# Patient Record
Sex: Female | Born: 1980 | Race: White | Hispanic: No | Marital: Married | Smoking: Never smoker
Health system: Southern US, Community
[De-identification: ages and names within clinical notes are randomized; demographics above are authoritative.]

## PROBLEM LIST (undated history)

## (undated) DIAGNOSIS — G43909 Migraine, unspecified, not intractable, without status migrainosus: Secondary | ICD-10-CM

## (undated) DIAGNOSIS — IMO0002 Reserved for concepts with insufficient information to code with codable children: Secondary | ICD-10-CM

## (undated) DIAGNOSIS — Z789 Other specified health status: Secondary | ICD-10-CM

## (undated) DIAGNOSIS — R110 Nausea: Secondary | ICD-10-CM

## (undated) DIAGNOSIS — O99340 Other mental disorders complicating pregnancy, unspecified trimester: Secondary | ICD-10-CM

## (undated) HISTORY — DX: Reserved for concepts with insufficient information to code with codable children: IMO0002

## (undated) HISTORY — DX: Nausea: R11.0

## (undated) HISTORY — DX: Migraine, unspecified, not intractable, without status migrainosus: G43.909

## (undated) HISTORY — PX: EYE SURGERY: SHX253

## (undated) HISTORY — PX: KNEE ARTHROSCOPY W/ ACL RECONSTRUCTION: SHX1858

## (undated) HISTORY — DX: Other mental disorders complicating pregnancy, unspecified trimester: O99.340

## (undated) HISTORY — PX: BREAST ENHANCEMENT SURGERY: SHX7

---

## 2007-01-16 ENCOUNTER — Other Ambulatory Visit: Admission: RE | Admit: 2007-01-16 | Discharge: 2007-01-16 | Payer: Self-pay | Admitting: Obstetrics and Gynecology

## 2008-03-02 ENCOUNTER — Other Ambulatory Visit: Admission: RE | Admit: 2008-03-02 | Discharge: 2008-03-02 | Payer: Self-pay | Admitting: Obstetrics and Gynecology

## 2010-06-05 ENCOUNTER — Inpatient Hospital Stay (HOSPITAL_COMMUNITY)
Admission: AD | Admit: 2010-06-05 | Discharge: 2010-06-07 | Payer: Self-pay | Source: Home / Self Care | Attending: Family Medicine | Admitting: Family Medicine

## 2010-06-09 ENCOUNTER — Encounter
Admission: RE | Admit: 2010-06-09 | Discharge: 2010-06-27 | Payer: Self-pay | Source: Home / Self Care | Attending: Obstetrics and Gynecology | Admitting: Obstetrics and Gynecology

## 2010-06-12 LAB — CBC
HCT: 41.7 % (ref 36.0–46.0)
HCT: 36.4 % (ref 36.0–46.0)
Hemoglobin: 14.5 g/dL (ref 12.0–15.0)
Hemoglobin: 12.2 g/dL (ref 12.0–15.0)
MCH: 31.4 pg (ref 26.0–34.0)
MCH: 30.3 pg (ref 26.0–34.0)
MCHC: 34.8 g/dL (ref 30.0–36.0)
MCHC: 33.5 g/dL (ref 30.0–36.0)
MCV: 90.3 fL (ref 78.0–100.0)
MCV: 90.5 fL (ref 78.0–100.0)
Platelets: 165 10*3/uL (ref 150–400)
Platelets: 137 10*3/uL — ABNORMAL LOW (ref 150–400)
RBC: 4.62 MIL/uL (ref 3.87–5.11)
RBC: 4.02 MIL/uL (ref 3.87–5.11)
RDW: 13 % (ref 11.5–15.5)
RDW: 13 % (ref 11.5–15.5)
WBC: 11.4 10*3/uL — ABNORMAL HIGH (ref 4.0–10.5)
WBC: 8.9 10*3/uL (ref 4.0–10.5)

## 2010-06-12 LAB — RPR: RPR Ser Ql: NONREACTIVE

## 2010-06-15 ENCOUNTER — Ambulatory Visit
Admission: RE | Admit: 2010-06-15 | Discharge: 2010-06-15 | Payer: Self-pay | Source: Home / Self Care | Admitting: Obstetrics and Gynecology

## 2010-09-26 ENCOUNTER — Inpatient Hospital Stay (HOSPITAL_COMMUNITY): Admission: AD | Admit: 2010-09-26 | Payer: Self-pay | Source: Home / Self Care | Admitting: Obstetrics and Gynecology

## 2010-11-26 ENCOUNTER — Inpatient Hospital Stay (HOSPITAL_COMMUNITY): Payer: 59

## 2010-11-26 ENCOUNTER — Inpatient Hospital Stay (HOSPITAL_COMMUNITY)
Admission: AD | Admit: 2010-11-26 | Discharge: 2010-11-26 | Disposition: A | Discharge status: Home / Self Care | Payer: 59 | Source: Ambulatory Visit | Attending: Obstetrics | Admitting: Obstetrics

## 2010-11-26 DIAGNOSIS — O26859 Spotting complicating pregnancy, unspecified trimester: Secondary | ICD-10-CM | POA: Insufficient documentation

## 2010-11-26 LAB — URINALYSIS, ROUTINE W REFLEX MICROSCOPIC
Bilirubin Urine: NEGATIVE
Glucose, UA: NEGATIVE mg/dL
Ketones, ur: NEGATIVE mg/dL
Leukocytes, UA: NEGATIVE
Nitrite: NEGATIVE
Protein, ur: NEGATIVE mg/dL
Specific Gravity, Urine: <1.005 — ABNORMAL LOW (ref 1.005–1.030)
Urobilinogen, UA: 0.2 mg/dL (ref 0.0–1.0)
pH: 6 (ref 5.0–8.0)

## 2010-11-26 LAB — URINE MICROSCOPIC-ADD ON

## 2010-12-14 LAB — ABO/RH: RH Type: POSITIVE

## 2010-12-14 LAB — GC/CHLAMYDIA PROBE AMP, GENITAL
Chlamydia: NEGATIVE
Gonorrhea: NEGATIVE

## 2010-12-14 LAB — HIV ANTIBODY (ROUTINE TESTING W REFLEX): HIV: NONREACTIVE

## 2010-12-14 LAB — RUBELLA ANTIBODY, IGM: Rubella: IMMUNE

## 2010-12-14 LAB — RPR: RPR: NONREACTIVE

## 2010-12-14 LAB — HEPATITIS B SURFACE ANTIGEN: Hepatitis B Surface Ag: NEGATIVE

## 2010-12-14 LAB — STREP B DNA PROBE: GBS: POSITIVE

## 2010-12-14 LAB — ANTIBODY SCREEN: Antibody Screen: NEGATIVE

## 2011-05-23 ENCOUNTER — Inpatient Hospital Stay (HOSPITAL_COMMUNITY)
Admission: AD | Admit: 2011-05-23 | Discharge: 2011-05-23 | Disposition: A | Discharge status: Home / Self Care | Payer: 59 | Source: Ambulatory Visit | Attending: Obstetrics and Gynecology | Admitting: Obstetrics and Gynecology

## 2011-05-23 ENCOUNTER — Encounter (HOSPITAL_COMMUNITY): Payer: Self-pay | Admitting: *Deleted

## 2011-05-23 DIAGNOSIS — O212 Late vomiting of pregnancy: Secondary | ICD-10-CM | POA: Insufficient documentation

## 2011-05-23 HISTORY — DX: Other specified health status: Z78.9

## 2011-05-23 LAB — CBC
HCT: 39 % (ref 36.0–46.0)
Hemoglobin: 13.4 g/dL (ref 12.0–15.0)
MCH: 31.2 pg (ref 26.0–34.0)
MCHC: 34.4 g/dL (ref 30.0–36.0)
MCV: 90.7 fL (ref 78.0–100.0)
Platelets: 150 10*3/uL (ref 150–400)
RBC: 4.3 MIL/uL (ref 3.87–5.11)
RDW: 13.2 % (ref 11.5–15.5)
WBC: 11.5 10*3/uL — ABNORMAL HIGH (ref 4.0–10.5)

## 2011-05-23 LAB — URINALYSIS, ROUTINE W REFLEX MICROSCOPIC
Bilirubin Urine: NEGATIVE
Glucose, UA: NEGATIVE mg/dL
Hgb urine dipstick: NEGATIVE
Ketones, ur: 15 mg/dL — AB
Nitrite: NEGATIVE
Protein, ur: NEGATIVE mg/dL
Specific Gravity, Urine: >1.03 — ABNORMAL HIGH (ref 1.005–1.030)
Urobilinogen, UA: 0.2 mg/dL (ref 0.0–1.0)
pH: 6 (ref 5.0–8.0)

## 2011-05-23 LAB — COMPREHENSIVE METABOLIC PANEL
ALT: 13 U/L (ref 0–35)
AST: 17 U/L (ref 0–37)
Albumin: 2.9 g/dL — ABNORMAL LOW (ref 3.5–5.2)
Alkaline Phosphatase: 85 U/L (ref 39–117)
BUN: 8 mg/dL (ref 6–23)
CO2: 24 mEq/L (ref 19–32)
Calcium: 8.5 mg/dL (ref 8.4–10.5)
Chloride: 103 mEq/L (ref 96–112)
Creatinine, Ser: 0.51 mg/dL (ref 0.50–1.10)
GFR calc Af Amer: >90 mL/min (ref >90)
GFR calc non Af Amer: >90 mL/min (ref >90)
Glucose, Bld: 84 mg/dL (ref 70–99)
Potassium: 4.1 mEq/L (ref 3.5–5.1)
Sodium: 136 mEq/L (ref 135–145)
Total Bilirubin: 0.5 mg/dL (ref 0.3–1.2)
Total Protein: 6.7 g/dL (ref 6.0–8.3)

## 2011-05-23 LAB — URINE MICROSCOPIC-ADD ON

## 2011-05-23 LAB — FETAL FIBRONECTIN: Fetal Fibronectin: NEGATIVE

## 2011-05-23 MED ORDER — DEXTROSE-NACL 5-0.45 % IV SOLN
INTRAVENOUS | Status: DC
  Administered 2011-05-23: 16:00:00 via INTRAVENOUS

## 2011-05-23 MED ORDER — PROMETHAZINE HCL 25 MG/ML IJ SOLN
12.5000 mg | Freq: Once | INTRAMUSCULAR | Status: AC
  Administered 2011-05-23: 16:00:00 via INTRAVENOUS
  Filled 2011-05-23: qty 1

## 2011-05-23 MED ORDER — ONDANSETRON 8 MG PO TBDP
8.0000 mg | ORAL_TABLET | Freq: Once | ORAL | Status: AC
  Administered 2011-05-23: 8 mg via ORAL
  Filled 2011-05-23: qty 1

## 2011-05-23 NOTE — Progress Notes (Signed)
Pt in c/o nausea and vomiting since 0500.  Reports lower back pain, but denies any abdominal pain or leaking of fluid or bleeding.  Denies any diarrhea.  Believes it may be from cheesecake that she ate yesterday. + FM.

## 2011-05-23 NOTE — ED Notes (Signed)
Waiting on mom to return 

## 2011-05-23 NOTE — ED Notes (Signed)
Pt still nauseated, has not thrown up.dozing at times.  Plan discussed, mom went to pick up rx.

## 2011-05-23 NOTE — Progress Notes (Signed)
Woke up this morning feeling sick.  (MOL is also sick)

## 2011-05-23 NOTE — ED Notes (Signed)
Nausea comes in waves, shorter now.  Water given, encouraged to sip slowly.

## 2011-05-23 NOTE — ED Provider Notes (Signed)
History   Nausea and vomiting  Chief Complaint  Patient presents with  . Emesis   HPI See dictated note  OB History    Grav Para Term Preterm Abortions TAB SAB Ect Mult Living   2 1 1  0 0 0 0 0 0 1      Past Medical History  Diagnosis Date  . No pertinent past medical history     Past Surgical History  Procedure Date  . Breast enhancement surgery   . Knee arthroscopy w/ acl reconstruction     left    Family History  Problem Relation Age of Onset  . Anesthesia problems Neg Hx     History  Substance Use Topics  . Smoking status: Never Smoker   . Smokeless tobacco: Never Used  . Alcohol Use: Yes     occ, none with preg    Allergies: No Known Allergies  No prescriptions prior to admission    ROS Physical Exam dictated   Blood pressure 120/72, pulse 95, temperature 98.2 F (36.8 C), temperature source Oral, resp. rate 18, height 5\' 5"  (1.651 m), weight 68.13 kg (150 lb 3.2 oz), unknown if currently breastfeeding.  Physical Exam  MAU Course  Procedures  MDM na  Assessment and Plan  33 weeks Nausea and vomiting- no s/s HELLP Will give Zofran and attempt to PO hydrate IVF if no improvement.  Kaian Fahs J 05/23/2011, 2:25 PM

## 2011-05-23 NOTE — ED Notes (Signed)
Plan discussed with MD and pt, will give Zofran ODT and then try p.o,. Hydration, MD going to OR, will write order for IV fluids if needed and p.o. Fluids not tolerated.

## 2011-05-24 NOTE — Consult Note (Signed)
NAMELAVAEH, BAU                 ACCOUNT NO.:  [id number]  MEDICAL RECORD NO.:  [id number]  LOCATION:  AV40                          FACILITY:  [facility]  PHYSICIAN:  Lenoard Aden, M.D.DATE OF BIRTH:  04-01-1981  DATE OF CONSULTATION:  05/23/2011 DATE OF DISCHARGE:  05/23/2011                                CONSULTATION   CHIEF COMPLAINT:  Nausea and vomiting.  HISTORY OF PRESENT ILLNESS:  A 30 year old white female,G2 P1 at [redacted] weeks gestation with uncomplicated pregnancy to date who presents with a short less than 8 hour onset of nausea and vomiting, questionably related to food ingestion.  She also has a family member who is currently ill with a similar illness.  She denies headaches, visual symptoms, or epigastric pain.  She denies diarrhea.  She reports good fetal movement.  She denies bleeding or leakage of fluid.  She denies contractions.  ALLERGIES:  She has no known drug allergies.  MEDICATIONS:  Zofran, which was prescribed.  Today the patient has not picked it up, and prenatal vitamins.  FAMILY HISTORY:  She has family history of multiple sclerosis, melanoma hypertension, heart disease, and diabetes.  SOCIAL HISTORY:  She is a nonsmoker, nondrinker.  Denies domestic or physical violence.  PAST MEDICAL HISTORY:  Uncomplicated vaginal delivery.  Pregnancy is complicated by GBS bacteriuria.  PAST SURGICAL HISTORY:  Remarkable for breast augmentation and ACL surgery.  PHYSICAL EXAMINATION:  GENERAL:  She is a well-developed, well- nourished, white female, in no acute distress. HEENT:  Normal. NECK:  Supple.  Full range of motion. LUNGS:  Clear. HEART:  Regular rhythm. ABDOMEN:  Soft, gravid, and nontender.  No right upper quadrant tenderness.  Cervical exam was deferred.  DTRs 2+.  No evidence of clonus.  No pretibial edema. EXTREMITIES:  No cords. NEUROLOGIC:  Nonfocal. SKIN:  Intact.  NST is reactive with irregular contractions noted.  CBC and CMP  is pending.  The patient did not respond to 1 oral dose of Zofran ODT 8 mg.  IMPRESSION: 1. A 33 week intrauterine pregnancy. 2. Nausea and vomiting.  Probable viral gastroenteritis, acute.  PLAN:  Start IV fluid hydration.  Check labs as noted.  Continuous monitoring, Zofran prescription previously sent.  Anticipate discharge home after IV fluid hydration.     Lenoard Aden, M.D.     RJT/MEDQ  D:  05/23/2011  T:  05/24/2011  Job:  [id number]

## 2011-05-29 NOTE — L&D Delivery Note (Signed)
Delivery Note At 11:33 PM a viable and healthy female was delivered via Vaginal, Spontaneous Delivery (Presentation: Right Occiput Anterior).  APGAR: 9, 9; weight .  2985gms Placenta status: Intact, Spontaneous.  Cord: 3 vessels with the following complications: None.  Cord pH: na  Anesthesia: Epidural  Episiotomy: None Lacerations: second Suture Repair: 2.0 vicryl rapide Est. Blood Loss (mL): 300  Mom to postpartum.  Baby to nursery-stable.  Jamea Robicheaux J 07/01/2011, 11:45 PM

## 2011-06-29 ENCOUNTER — Encounter (HOSPITAL_COMMUNITY): Payer: Self-pay | Admitting: *Deleted

## 2011-06-29 ENCOUNTER — Telehealth (HOSPITAL_COMMUNITY): Payer: Self-pay | Admitting: *Deleted

## 2011-06-29 NOTE — Telephone Encounter (Signed)
Preadmission screen  

## 2011-07-01 ENCOUNTER — Inpatient Hospital Stay (HOSPITAL_COMMUNITY): Payer: 59 | Admitting: Anesthesiology

## 2011-07-01 ENCOUNTER — Encounter (HOSPITAL_COMMUNITY): Payer: Self-pay | Admitting: *Deleted

## 2011-07-01 ENCOUNTER — Inpatient Hospital Stay (HOSPITAL_COMMUNITY)
Admission: AD | Admit: 2011-07-01 | Discharge: 2011-07-03 | DRG: 775 | Disposition: A | Discharge status: Home / Self Care | Payer: 59 | Source: Ambulatory Visit | Attending: Obstetrics & Gynecology | Admitting: Obstetrics & Gynecology

## 2011-07-01 ENCOUNTER — Encounter (HOSPITAL_COMMUNITY): Payer: Self-pay | Admitting: Anesthesiology

## 2011-07-01 DIAGNOSIS — Z22330 Carrier of Group B streptococcus: Secondary | ICD-10-CM

## 2011-07-01 DIAGNOSIS — O99892 Other specified diseases and conditions complicating childbirth: Secondary | ICD-10-CM | POA: Diagnosis present

## 2011-07-01 LAB — CBC
MCH: 31.2 pg (ref 26.0–34.0)
MCHC: 34.6 g/dL (ref 30.0–36.0)
MCV: 90.3 fL (ref 78.0–100.0)
Platelets: 176 10*3/uL (ref 150–400)
RDW: 13.2 % (ref 11.5–15.5)

## 2011-07-01 MED ORDER — LACTATED RINGERS IV SOLN: 500.0000 mL | INTRAVENOUS | Status: DC | PRN

## 2011-07-01 MED ORDER — PHENYLEPHRINE 40 MCG/ML (10ML) SYRINGE FOR IV PUSH (FOR BLOOD PRESSURE SUPPORT)
80.0000 ug | PREFILLED_SYRINGE | INTRAVENOUS | Status: DC | PRN
  Filled 2011-07-01 (×2): qty 5

## 2011-07-01 MED ORDER — OXYTOCIN BOLUS FROM INFUSION
500.0000 mL | Freq: Once | INTRAVENOUS | Status: DC
  Filled 2011-07-01: qty 500

## 2011-07-01 MED ORDER — PSEUDOEPHEDRINE HCL 30 MG PO TABS: 30.0000 mg | ORAL_TABLET | Freq: Three times a day (TID) | ORAL | Status: DC | PRN

## 2011-07-01 MED ORDER — OXYTOCIN 20 UNITS IN LACTATED RINGERS INFUSION - SIMPLE
125.0000 mL/h | Freq: Once | INTRAVENOUS | Status: AC
  Administered 2011-07-01: 125 mL/h via INTRAVENOUS

## 2011-07-01 MED ORDER — PENICILLIN G POTASSIUM 5000000 UNITS IJ SOLR
2.5000 10*6.[IU] | INTRAVENOUS | Status: DC
  Filled 2011-07-01 (×5): qty 2.5

## 2011-07-01 MED ORDER — PHENYLEPHRINE 40 MCG/ML (10ML) SYRINGE FOR IV PUSH (FOR BLOOD PRESSURE SUPPORT): 80.0000 ug | PREFILLED_SYRINGE | INTRAVENOUS | Status: DC | PRN

## 2011-07-01 MED ORDER — IBUPROFEN 600 MG PO TABS: 600.0000 mg | ORAL_TABLET | Freq: Four times a day (QID) | ORAL | Status: DC | PRN

## 2011-07-01 MED ORDER — EPHEDRINE 5 MG/ML INJ
10.0000 mg | INTRAVENOUS | Status: DC | PRN
  Filled 2011-07-01 (×2): qty 4

## 2011-07-01 MED ORDER — ACETAMINOPHEN 325 MG PO TABS: 650.0000 mg | ORAL_TABLET | ORAL | Status: DC | PRN

## 2011-07-01 MED ORDER — FENTANYL 2.5 MCG/ML BUPIVACAINE 1/10 % EPIDURAL INFUSION (WH - ANES)
INTRAMUSCULAR | Status: DC | PRN
  Administered 2011-07-01: 14 mL/h via EPIDURAL

## 2011-07-01 MED ORDER — FENTANYL 2.5 MCG/ML BUPIVACAINE 1/10 % EPIDURAL INFUSION (WH - ANES)
14.0000 mL/h | INTRAMUSCULAR | Status: DC
  Filled 2011-07-01 (×2): qty 60

## 2011-07-01 MED ORDER — TERBUTALINE SULFATE 1 MG/ML IJ SOLN: 0.2500 mg | Freq: Once | INTRAMUSCULAR | Status: AC | PRN

## 2011-07-01 MED ORDER — PENICILLIN G POTASSIUM 5000000 UNITS IJ SOLR
5.0000 10*6.[IU] | Freq: Once | INTRAVENOUS | Status: AC
  Administered 2011-07-01: 5 10*6.[IU] via INTRAVENOUS
  Filled 2011-07-01: qty 5

## 2011-07-01 MED ORDER — CITRIC ACID-SODIUM CITRATE 334-500 MG/5ML PO SOLN: 30.0000 mL | ORAL | Status: DC | PRN

## 2011-07-01 MED ORDER — LIDOCAINE HCL (PF) 1 % IJ SOLN
30.0000 mL | INTRAMUSCULAR | Status: DC | PRN
  Filled 2011-07-01: qty 30

## 2011-07-01 MED ORDER — EPHEDRINE 5 MG/ML INJ: 10.0000 mg | INTRAVENOUS | Status: DC | PRN

## 2011-07-01 MED ORDER — LACTATED RINGERS IV SOLN
INTRAVENOUS | Status: DC
  Administered 2011-07-01: 20:00:00 via INTRAVENOUS

## 2011-07-01 MED ORDER — LACTATED RINGERS IV SOLN: 500.0000 mL | Freq: Once | INTRAVENOUS | Status: DC

## 2011-07-01 MED ORDER — FLEET ENEMA 7-19 GM/118ML RE ENEM: 1.0000 | ENEMA | RECTAL | Status: DC | PRN

## 2011-07-01 MED ORDER — DIPHENHYDRAMINE HCL 50 MG/ML IJ SOLN: 12.5000 mg | INTRAMUSCULAR | Status: DC | PRN

## 2011-07-01 MED ORDER — OXYCODONE-ACETAMINOPHEN 5-325 MG PO TABS: 1.0000 | ORAL_TABLET | ORAL | Status: DC | PRN

## 2011-07-01 MED ORDER — ONDANSETRON HCL 4 MG/2ML IJ SOLN
4.0000 mg | Freq: Four times a day (QID) | INTRAMUSCULAR | Status: DC | PRN
  Administered 2011-07-01: 4 mg via INTRAVENOUS
  Filled 2011-07-01: qty 2

## 2011-07-01 MED ORDER — OXYTOCIN 20 UNITS IN LACTATED RINGERS INFUSION - SIMPLE
1.0000 m[IU]/min | INTRAVENOUS | Status: DC
  Administered 2011-07-01: 2 m[IU]/min via INTRAVENOUS
  Filled 2011-07-01: qty 1000

## 2011-07-01 MED ORDER — SODIUM BICARBONATE 8.4 % IV SOLN
INTRAVENOUS | Status: DC | PRN
  Administered 2011-07-01: 4 mL via EPIDURAL

## 2011-07-01 NOTE — Progress Notes (Signed)
Stacie Lopez is a 31 y.o. G2P1001 at [redacted]w[redacted]d by LMP admitted for active labor  Subjective:   Objective: BP 117/84  Pulse 87  Temp(Src) 98.5 F (36.9 C) (Oral)  Resp 20  Ht 5\' 5"  (1.651 m)  Wt 70.943 kg (156 lb 6.4 oz)  BMI 26.03 kg/m2  SpO2 100%  Breastfeeding? Unknown      FHT:  FHR: 150 bpm, variability: moderate,  accelerations:  Present,  decelerations:  Absent UC:   irregular, every 2-4 minutes SVE:   6/80/0 AROM- clear  Labs: Lab Results  Component Value Date   WBC 9.5 07/01/2011   HGB 12.9 07/01/2011   HCT 37.3 07/01/2011   MCV 90.3 07/01/2011   PLT 176 07/01/2011    Assessment / Plan: Spontaneous labor, progressing normally  Labor: Progressing normally Preeclampsia:  na Fetal Wellbeing:  Category I Pain Control:  Epidural I/D:  n/a Anticipated MOD:  NSVD  Danni Shima J 07/01/2011, 9:41 PM

## 2011-07-01 NOTE — Anesthesia Procedure Notes (Signed)

## 2011-07-01 NOTE — Progress Notes (Signed)
Jose Alleyne is a 31 y.o. G2P1001 at [redacted]w[redacted]d by LMP admitted for active labor  Subjective: contractions  Objective: BP 119/68  Pulse 81  Temp(Src) 98.5 F (36.9 C) (Oral)  Resp 18  Ht 5\' 5"  (1.651 m)  Wt 70.943 kg (156 lb 6.4 oz)  BMI 26.03 kg/m2  Breastfeeding? Unknown      FHT:  FHR: 155 bpm, variability: moderate,  accelerations:  Present,  decelerations:  Absent UC:   irregular, every 2-5 minutes SVE:   Dilation: 5.5 Effacement (%): 80 Station: -1;0 Exam by:: Humphrey Rolls, RN  Labs: Lab Results  Component Value Date   WBC 9.5 07/01/2011   HGB 12.9 07/01/2011   HCT 37.3 07/01/2011   MCV 90.3 07/01/2011   PLT 176 07/01/2011    Assessment / Plan: Spontaneous labor, progressing normally  Labor: Progressing normally Preeclampsia:  na Fetal Wellbeing:  Category I Pain Control:  Epidural I/D:  n/a Anticipated MOD:  NSVD  Britne Borelli J 07/01/2011, 8:10 PM  wq

## 2011-07-01 NOTE — Progress Notes (Signed)
Pt may go to room 170. 

## 2011-07-01 NOTE — Progress Notes (Signed)
Pt reprots having ctx q 4 min for the past 2 hours. Pt deneis SROM or bleeding at this time. Reports good fetal movement.

## 2011-07-01 NOTE — Anesthesia Preprocedure Evaluation (Signed)

## 2011-07-01 NOTE — Progress Notes (Signed)
Waiting on call back from md.

## 2011-07-01 NOTE — H&P (Signed)
NAMENOORAH, GIAMMONA                 ACCOUNT NO.:  [id number]  MEDICAL RECORD NO.:  [id number]  LOCATION:  9164                          FACILITY:  [facility]  PHYSICIAN:  Lenoard Aden, M.D.DATE OF BIRTH:  1980-10-27  DATE OF ADMISSION:  07/01/2011 DATE OF DISCHARGE:                             HISTORY & PHYSICAL   CHIEF COMPLAINT:  Labor.  HISTORY OF PRESENT ILLNESS:  A 31 year old white female, G2, P1 at 44 and 2/7th weeks' gestation with increased frequency of contractions today.  SOCIAL HISTORY:  She is a nonsmoker, nondrinker. She denies domestic or physical violence.  ALLERGIES:  She has no known drug allergies.  MEDICATIONS:  Zofran, p.r.n. Zoloft, and prenatal vitamins.  PAST MEDICAL HISTORY:  She has a noncontributory medical history.  FAMILY HISTORY:  She has a family history of diabetes, hypertension, melanoma, and multiple sclerosis.  PAST OB/GYN HISTORY:  Previous history of spontaneous vaginal delivery in January 2000.  PAST SURGICAL HISTORY:  She has a surgical history remarkable for ACL surgery and breast augmentation.  PHYSICAL EXAMINATION:  GENERAL:  A well-developed, well-nourished white female, in no acute distress. HEENT:  Normal. NECK:  Supple.  Full range of motion. LUNGS:  Clear. HEART:  Regular rhythm. ABDOMEN:  Soft, gravid, nontender.  Estimated fetal weight 7-1/2 pounds. Cervix per RN, 5 cm, 80% vertex, -1. EXTREMITIES:  There are no cords. NEUROLOGIC:  Nonfocal. SKIN: Intact.  IMPRESSION: 1. Term intrauterine pregnancy in active labor. 2. Group B Streptococcus positive.  PLAN:  To admit.  Epidural Pitocin as needed.     Lenoard Aden, M.D.     RJT/MEDQ  D:  07/01/2011  T:  07/01/2011  Job:  [id number]

## 2011-07-01 NOTE — Progress Notes (Signed)
Dr. Billy Coast notified of VE and ctx pattern.  Notified of GBS +. Admit orders received.

## 2011-07-02 ENCOUNTER — Encounter (HOSPITAL_COMMUNITY): Payer: Self-pay | Admitting: *Deleted

## 2011-07-02 LAB — CBC
HCT: 32.4 % — ABNORMAL LOW (ref 36.0–46.0)
Hemoglobin: 11 g/dL — ABNORMAL LOW (ref 12.0–15.0)
MCH: 30.7 pg (ref 26.0–34.0)
MCHC: 34 g/dL (ref 30.0–36.0)
MCV: 90.5 fL (ref 78.0–100.0)
RDW: 13.2 % (ref 11.5–15.5)

## 2011-07-02 MED ORDER — SIMETHICONE 80 MG PO CHEW: 80.0000 mg | CHEWABLE_TABLET | ORAL | Status: DC | PRN

## 2011-07-02 MED ORDER — LANOLIN HYDROUS EX OINT: TOPICAL_OINTMENT | CUTANEOUS | Status: DC | PRN

## 2011-07-02 MED ORDER — TETANUS-DIPHTH-ACELL PERTUSSIS 5-2.5-18.5 LF-MCG/0.5 IM SUSP
0.5000 mL | Freq: Once | INTRAMUSCULAR | Status: AC
  Administered 2011-07-02: 0.5 mL via INTRAMUSCULAR
  Filled 2011-07-02: qty 0.5

## 2011-07-02 MED ORDER — METHYLERGONOVINE MALEATE 0.2 MG PO TABS: 0.2000 mg | ORAL_TABLET | ORAL | Status: DC | PRN

## 2011-07-02 MED ORDER — METHYLERGONOVINE MALEATE 0.2 MG/ML IJ SOLN: 0.2000 mg | INTRAMUSCULAR | Status: DC | PRN

## 2011-07-02 MED ORDER — ZOLPIDEM TARTRATE 5 MG PO TABS: 5.0000 mg | ORAL_TABLET | Freq: Every evening | ORAL | Status: DC | PRN

## 2011-07-02 MED ORDER — PSEUDOEPHEDRINE HCL 30 MG PO TABS: 30.0000 mg | ORAL_TABLET | Freq: Four times a day (QID) | ORAL | Status: DC | PRN

## 2011-07-02 MED ORDER — OXYCODONE-ACETAMINOPHEN 5-325 MG PO TABS
1.0000 | ORAL_TABLET | ORAL | Status: DC | PRN
  Administered 2011-07-02 – 2011-07-03 (×4): 1 via ORAL
  Filled 2011-07-02 (×4): qty 1

## 2011-07-02 MED ORDER — OXYTOCIN 20 UNITS IN LACTATED RINGERS INFUSION - SIMPLE: 125.0000 mL/h | INTRAVENOUS | Status: DC

## 2011-07-02 MED ORDER — IBUPROFEN 600 MG PO TABS
600.0000 mg | ORAL_TABLET | Freq: Four times a day (QID) | ORAL | Status: DC
  Administered 2011-07-02 – 2011-07-03 (×6): 600 mg via ORAL
  Filled 2011-07-02 (×5): qty 1

## 2011-07-02 MED ORDER — BENZOCAINE-MENTHOL 20-0.5 % EX AERO: 1.0000 "application " | INHALATION_SPRAY | CUTANEOUS | Status: DC | PRN

## 2011-07-02 MED ORDER — DIBUCAINE 1 % RE OINT: 1.0000 "application " | TOPICAL_OINTMENT | RECTAL | Status: DC | PRN

## 2011-07-02 MED ORDER — ONDANSETRON HCL 4 MG/2ML IJ SOLN: 4.0000 mg | INTRAMUSCULAR | Status: DC | PRN

## 2011-07-02 MED ORDER — SENNOSIDES-DOCUSATE SODIUM 8.6-50 MG PO TABS
2.0000 | ORAL_TABLET | Freq: Every day | ORAL | Status: DC
  Administered 2011-07-02: 2 via ORAL

## 2011-07-02 MED ORDER — DIPHENHYDRAMINE HCL 25 MG PO CAPS: 25.0000 mg | ORAL_CAPSULE | Freq: Four times a day (QID) | ORAL | Status: DC | PRN

## 2011-07-02 MED ORDER — ONDANSETRON HCL 4 MG PO TABS: 4.0000 mg | ORAL_TABLET | ORAL | Status: DC | PRN

## 2011-07-02 MED ORDER — PRENATAL MULTIVITAMIN CH
1.0000 | ORAL_TABLET | Freq: Every day | ORAL | Status: DC
  Administered 2011-07-02 – 2011-07-03 (×2): 1 via ORAL
  Filled 2011-07-02 (×2): qty 1

## 2011-07-02 MED ORDER — WITCH HAZEL-GLYCERIN EX PADS: 1.0000 "application " | MEDICATED_PAD | CUTANEOUS | Status: DC | PRN

## 2011-07-02 NOTE — Progress Notes (Signed)
PPD 1 SVD  S:  Reports feeling well.             Tolerating po/ No nausea or vomiting             Bleeding is moderate             Pain controlled withMotrin.             Up ad lib / ambulatory  Newborn  Information for the patient's newborn:  Special, Ranes [[id number]]  female  bottle feeding    O:  A & O x 3 NAD             VS: Blood pressure 110/78, pulse 72, temperature 98.4 F (36.9 C), temperature source Oral, resp. rate 18, height 5\' 5"  (1.651 m), weight 156 lb 6.4 oz (70.943 kg), SpO2 99.00%, unknown if currently breastfeeding.  LABS:  Basename 07/02/11 0525 07/01/11 1940  HGB 11.0* 12.9  HCT 32.4* 37.3    I&O: I/O last 3 completed shifts: In: -  Out: 300 [Urine:300]      Lungs: Clear and unlabored  Heart: regular rate and rhythm / no mumurs  Abdomen: soft, non-tender, non-distended, lax abdominal muscles             Fundus: firm, non-tender, @U   Perineum: repair intact, no edema  Lochia: small  Extremities: no edema, no calf pain or tenderness, neg Homans    A/P: PPD # 1 30 y.o., [id number] S/P:spontaneous vaginal   Principal Problem:  *PP care - s/p SVD 2/3   Doing well - stable status  Routine post partum orders  Abdominal binder while ambulating for comfort  Tight bra, motrin , no stimulation for engorgement Anticipate discharge home in AM.   PAUL,DANIELA, CNM, MSN 07/02/2011, 9:59 AM

## 2011-07-02 NOTE — Anesthesia Postprocedure Evaluation (Signed)
  Anesthesia Post-op Note  Patient: Stacie Lopez  Procedure(s) Performed: * No procedures listed *  Patient Location: Mother/Baby  Anesthesia Type: Epidural  Level of Consciousness: awake, alert  and oriented  Airway and Oxygen Therapy: Patient Spontanous Breathing  Post-op Pain: none  Post-op Assessment: Post-op Vital signs reviewed, Patient's Cardiovascular Status Stable, No headache, No backache, No residual numbness and No residual motor weakness  Post-op Vital Signs: Reviewed and stable  Complications: No apparent anesthesia complications

## 2011-07-03 MED ORDER — OXYCODONE-ACETAMINOPHEN 5-325 MG PO TABS: 1.0000 | ORAL_TABLET | ORAL | Status: AC | PRN

## 2011-07-03 MED ORDER — IBUPROFEN 600 MG PO TABS: 600.0000 mg | ORAL_TABLET | Freq: Four times a day (QID) | ORAL | Status: AC

## 2011-07-03 NOTE — Progress Notes (Signed)
Referred by: CN    On: 07/03/11  For: History of depression  Patient Interview: X Family Interview   Other:   PSYCHOSOCIAL DATA:   Lives Alone  Lives with: Spouse  Admitted from Facility: Level of Care:  Primary Support (Name/Relationship):  Alferd Apa, spouse Degree of support available:   Involved  CURRENT CONCERNS:     None noted Substance Abuse     Behavioral Health Issues: X    Financial Resources     Abuse/Neglect/Domestic Violence   Cultural/Religious Issues     Post-Acute Placement    Adjustment to Illness     Knowledge/Cognitive Deficit     Other ___________________________________________________________________    SOCIAL WORK ASSESSMENT/PLAN:  Sw referral received to assess "history of depression."  Pt explained that she was feeling anxious towards the end of her pregnancy, as she worried about caring for 2 small children.  Pt was prescribed Zoloft, of which she never took.  She told Sw that she anxious symptoms resolved after her spouse told her that they would hire a nanny to assist with children.  She denies any depression or SI/HI.  Pt's spouse is at the bedside and supportive.  Sw discussed PP depression briefly with the pt.  Pt told Sw that she is feeling fine now because she will have help.   No Further Intervention Required: X Psychosocial Support/Ongoing Assessment of Needs Information/Referral to [facility]         Other                PATIENT'S/FAMILY'S RESPONSE TO PLAN OF CARE:   Pt thanked Sw for consult.

## 2011-07-03 NOTE — Discharge Summary (Signed)
Obstetric Discharge Summary Reason for Admission: onset of labor Prenatal Procedures: none Intrapartum Procedures: spontaneous vaginal delivery Postpartum Procedures: none Complications-Operative and Postpartum: 2nd degree perineal laceration Hemoglobin  Date Value Range Status  07/02/2011 11.0* 12.0-15.0 (g/dL) Final     HCT  Date Value Range Status  07/02/2011 32.4* 36.0-46.0 (%) Final    Discharge Diagnoses: Term Pregnancy-delivered  Discharge Information: Date: 07/03/2011 Activity: pelvic rest Diet: routine Medications: PNV, Ibuprofen and Percocet Condition: stable Instructions: refer to practice specific booklet Discharge to: home Follow-up Information    Follow up with Lenoard Aden, MD. Schedule an appointment as soon as possible for a visit in 6 weeks.   Contact information:   [address] [place] [address] [phone]          Newborn Data: Live born female  Birth Weight: 6 lb 9.3 oz (2985 g) APGAR: 9, 9  Home with mother.  Devario Bucklew 07/03/2011, 10:25 AM

## 2011-07-03 NOTE — Progress Notes (Signed)
Patient ID: Stacie Lopez, female   DOB: [birth year], 31 y.o.   MRN: [id number]  PPD 2 SVD  S:  Reports feeling well             Tolerating po/ No nausea or vomiting             Bleeding is light             Pain controlled with motrin and percocet             Up ad lib / ambulatory  Newborn breast feeding  / female   O:  A & O x 3 NAD             VS: Blood pressure 107/70, pulse 67, temperature 98.4 F (36.9 C), temperature source Oral, resp. rate 18, height 5\' 5"  (1.651 m), weight 70.943 kg (156 lb 6.4 oz), SpO2 99.00%, unknown if currently breastfeeding.  Lungs: Clear and unlabored  Heart: regular rate and rhythm / no mumurs  Abdomen: soft, non-tender, non-distended              Fundus: firm, non-tender, U-1  Perineum: no edema  Lochia: light  Extremities: no edema, no calf pain or tenderness    A: PPD # 2   Doing well - stable status  P:  Routine post partum orders  Discharge to home  [facility] 07/03/2011, 10:23 AM

## 2011-07-05 ENCOUNTER — Inpatient Hospital Stay (HOSPITAL_COMMUNITY): Admission: RE | Admit: 2011-07-05 | Payer: 59 | Source: Ambulatory Visit

## 2012-06-29 ENCOUNTER — Ambulatory Visit (INDEPENDENT_AMBULATORY_CARE_PROVIDER_SITE_OTHER): Payer: BC Managed Care – PPO | Admitting: Family Medicine

## 2012-06-29 ENCOUNTER — Ambulatory Visit: Payer: BC Managed Care – PPO

## 2012-06-29 VITALS — BP 110/80 | HR 86 | Temp 98.5°F | Resp 16 | Ht 65.0 in | Wt 122.0 lb

## 2012-06-29 DIAGNOSIS — R6889 Other general symptoms and signs: Secondary | ICD-10-CM

## 2012-06-29 DIAGNOSIS — R110 Nausea: Secondary | ICD-10-CM

## 2012-06-29 DIAGNOSIS — J111 Influenza due to unidentified influenza virus with other respiratory manifestations: Secondary | ICD-10-CM

## 2012-06-29 MED ORDER — OSELTAMIVIR PHOSPHATE 75 MG PO CAPS: 75.0000 mg | ORAL_CAPSULE | Freq: Two times a day (BID) | ORAL | Status: DC

## 2012-06-29 MED ORDER — ONDANSETRON HCL 8 MG PO TABS: 8.0000 mg | ORAL_TABLET | Freq: Three times a day (TID) | ORAL | Status: DC | PRN

## 2012-06-29 MED ORDER — ONDANSETRON 4 MG PO TBDP
8.0000 mg | ORAL_TABLET | Freq: Once | ORAL | Status: AC
  Administered 2012-06-29: 8 mg via ORAL

## 2012-06-29 MED ORDER — HYDROCODONE-HOMATROPINE 5-1.5 MG/5ML PO SYRP: 5.0000 mL | ORAL_SOLUTION | Freq: Three times a day (TID) | ORAL | Status: DC | PRN

## 2012-06-29 NOTE — Patient Instructions (Addendum)
Rest and drink plenty of fluids, use the cough syrup and zofran as needed, and the tamiflu twice a day. If you are getting worse please let me know.    Call your pediatrician's office and get their advice about tamiflu for your children.  If they feel that tamiflu prophylaxis is a good idea we can call in the liquid form- we will need your children's weights

## 2012-06-29 NOTE — Progress Notes (Signed)
[facility] [address] [place] [address] [phone]  Date:  06/29/2012   Name:  Stacie Lopez   DOB:  31-Dec-1980   MRN:  [id number]  PCP:  No primary provider on file.    Chief Complaint: Generalized Body Aches, Headache, Cough and Nausea   History of Present Illness:  Stacie Lopez is a 32 y.o. very pleasant female patient who presents with the following:  She is here today with illness- she has body aches, fatigue, chest and back hurts, she is vomiting.  She does have a cough, she did have an earache yersterday, head congestion She had a fever of 101 yesterday at home.  She had had a little bit of diarrhea  She is generally healthy  Her husband was ill last week, but his symptom were not as severe LMP about 3 weeks ago  She had a baby about one year ago- no longer nursing.    Her 2 children- 51 and 76 years old- had their flu shots a couple of months ago.    Patient Active Problem List  Diagnosis  . PP care - s/p SVD 2/3    Past Medical History  Diagnosis Date  . No pertinent past medical history   . Mental disorders of mother, antepartum(648.43)     depression  . Nausea alone   . Migraine, unspecified, without mention of intractable migraine without mention of status migrainosus   . Other specified maternal infectious and parasitic disease complicating pregnancy, childbirth, or the puerperium, unspecified as to episode of care(647.80)     B strep  . PP care - s/p SVD 2/3 07/02/2011    Past Surgical History  Procedure Date  . Breast enhancement surgery   . Knee arthroscopy w/ acl reconstruction     left    History  Substance Use Topics  . Smoking status: Never Smoker   . Smokeless tobacco: Never Used  . Alcohol Use: Yes     Comment: occ, none with preg    Family History  Problem Relation Age of Onset  . Anesthesia problems Neg Hx   . Heart disease Father   . Diabetes Maternal Grandmother   . Cancer Maternal Grandmother      melanoma  . Hypertension Maternal Grandmother   . Multiple sclerosis Maternal Grandfather     No Known Allergies  Medication list has been reviewed and updated.  Current Outpatient Prescriptions on File Prior to Visit  Medication Sig Dispense Refill  . pseudoephedrine (SUDAFED) 30 MG tablet Take 30 mg by mouth every 4 (four) hours as needed. For cold symptoms      . Prenatal Vit-Fe Fumarate-FA (PRENATAL MULTIVITAMIN) TABS Take 1 tablet by mouth daily.          Review of Systems:  As per HPI- otherwise negative.   Physical Examination: Filed Vitals:   06/29/12 0855  BP: 110/80  Pulse: 86  Temp: 98.5 F (36.9 C)  Resp: 16   Filed Vitals:   06/29/12 0855  Height: 5\' 5"  (1.651 m)  Weight: 122 lb (55.339 kg)   Body mass index is 20.30 kg/(m^2). Ideal Body Weight: Weight in (lb) to have BMI = 25: 149.9   GEN: WDWN, NAD, Non-toxic, A & O x 3 HEENT: Atraumatic, Normocephalic. Neck supple. No masses, No LAD.  Bilateral TM wnl, oropharynx normal.  PEERL,EOMI.   Ears and Nose: No external deformity. CV: RRR, No M/G/R. No JVD. No thrill. No extra heart sounds. PULM:  CTA B, no wheezes, crackles, rhonchi. No retractions. No resp. distress. No accessory muscle use. ABD: S, NT, ND, +BS. No rebound. No HSM.  No abdominal pain but pressure on abdomen causes nausea EXTR: No c/c/e NEURO Normal gait.  PSYCH: Normally interactive. Conversant. Not depressed or anxious appearing.  Calm demeanor.   Results for orders placed in visit on 06/29/12  POCT INFLUENZA A/B      Component Value Range   Influenza A, POC Positive     Influenza B, POC Positive     [facility] reading (PRIMARY) by  Dr. Patsy Lager. CXR: negative  Assessment and Plan: 1. Flu-like symptoms  POCT Influenza A/B, DG Chest 2 View  2. Nausea  ondansetron (ZOFRAN-ODT) disintegrating tablet 8 mg  3. Influenza  oseltamivir (TAMIFLU) 75 MG capsule, HYDROcodone-homatropine (HYCODAN) 5-1.5 MG/5ML syrup, ondansetron (ZOFRAN) 8 MG tablet    Flu A and B.  Zofran here in clinic helped a lot- she felt much better.  Will treat with tamiflu BID, hycodan and zofran PRN.  She will let us know if not feeling better in the next 2 or 3 days, Sooner if worse.   Benton Tooker, MD Wrote a tamiflu 75 daily rx for her husband Minh Jasper III, DOB 04/19/79.  Their pediatrician advised against prophylaxis for their 2 children at this time.

## 2013-08-27 ENCOUNTER — Encounter: Payer: Self-pay | Admitting: Neurology

## 2013-08-27 ENCOUNTER — Ambulatory Visit (INDEPENDENT_AMBULATORY_CARE_PROVIDER_SITE_OTHER): Payer: BC Managed Care – PPO | Admitting: Neurology

## 2013-08-27 ENCOUNTER — Encounter (INDEPENDENT_AMBULATORY_CARE_PROVIDER_SITE_OTHER): Payer: Self-pay

## 2013-08-27 VITALS — BP 118/85 | HR 94 | Temp 99.4°F | Ht 65.0 in | Wt 123.0 lb

## 2013-08-27 DIAGNOSIS — J020 Streptococcal pharyngitis: Secondary | ICD-10-CM

## 2013-08-27 DIAGNOSIS — G478 Other sleep disorders: Secondary | ICD-10-CM

## 2013-08-27 DIAGNOSIS — G2581 Restless legs syndrome: Secondary | ICD-10-CM

## 2013-08-27 DIAGNOSIS — J0301 Acute recurrent streptococcal tonsillitis: Secondary | ICD-10-CM

## 2013-08-27 DIAGNOSIS — R400 Somnolence: Secondary | ICD-10-CM

## 2013-08-27 DIAGNOSIS — G479 Sleep disorder, unspecified: Secondary | ICD-10-CM

## 2013-08-27 DIAGNOSIS — Z8709 Personal history of other diseases of the respiratory system: Secondary | ICD-10-CM

## 2013-08-27 DIAGNOSIS — G4710 Hypersomnia, unspecified: Secondary | ICD-10-CM

## 2013-08-27 DIAGNOSIS — J1189 Influenza due to unidentified influenza virus with other manifestations: Secondary | ICD-10-CM

## 2013-08-27 DIAGNOSIS — A084 Viral intestinal infection, unspecified: Secondary | ICD-10-CM

## 2013-08-27 NOTE — Patient Instructions (Signed)
We will schedule you for a sleep study and then I will see you back.

## 2013-08-27 NOTE — Progress Notes (Addendum)
Subjective:    Patient ID: Stacie SciaraLondon Cincotta is a 33 y.o. female.  HPI    Huston FoleySaima Arnika Larzelere, MD, PhD [facility] [address], [address] [address] [place], [place] [id number]  Dear Dr. Nicholos Johnsamachandran,  I saw your patient, Stacie Lopez, upon your kind request in my neurologic clinic today for initial consultation of her sleep disorder, in particular, concern for obstructive sleep apnea. The patient is unaccompanied today. As you know, Ms. Harl BowieKern is a 33 year old right-handed woman with an underlying medical history of allergies, and insomnia, who has had recurrent sore throat recently. She was treated with azithromycin, then was given a prescription for levofloxacin. She has had frequent issues with recurrent sore throat and has also complained of increasing daytime tiredness since December 2014. She has trouble falling asleep and has tried Tylenol PM, melatonin and Ambien up to 10 mg. While she goes to sleep within 30 minutes of taking Ambien she wakes up after approximately one hour and has trouble maintaining sleep. You prescribed lorazepam 1 mg once daily as needed on 08/19/2013. Blood work from 08/19/2013 showed normal TSH, normal CMP and normal CBC with differential. Her sleep issues started after she had children, she now has a 673 yo and a 2 yo. She has started taking lorazepam 2 mg qHS in the past 3 days, and she is on melatonin 1 mg for the past month, which may not have helped. She is currently getting about 4-5 hours of sleep. She is stressed about this. She has some forgetfulness and has had some mood irritability. Her mother has a poor sleeper, she states. No OSA in her family, but she does not know much about the paternal side of the family.  Of note, she had strep throat twice recently, the flu and a stomach virus. As she is not typically a sickly person.  Her typical bedtime is reported to be around 9:30 to 10 PM and usual wake time is around 7 to 7:30 AM. Sleep onset typically  occurs within 20 to 30 minutes with medications. She reports feeling poorly rested upon awakening. She wakes up on an average 1 times in the middle of the night and has to go to the bathroom 0 times on a typical night. She denies morning headaches.  She reports excessive daytime somnolence (EDS) and Her Epworth Sleepiness Score (ESS) is 12/24 today. She has not fallen asleep while driving. The patient has not been taking a planned nap. She does not snore, and has no apneas and denies waking up gasping, but may be a mouth breather.  There is no report of nighttime reflux, with no nighttime cough experienced. The patient has some difficulty finding a comfortable and may have mild RLS symptoms, but is not known to kick while asleep or before falling asleep.  She denies cataplexy, sleep paralysis, hypnagogic or hypnopompic hallucinations, or sleep attacks. She does not report any vivid dreams, nightmares, dream enactments, or parasomnias, such as sleep talking or sleep walking. The patient has not had a sleep study or a home sleep test.  She consumes 1 caffeinated beverages per day, usually in the form of coffee or soda.   Her bedroom is usually dark and cool. There is a TV in the bedroom and usually it is not on at night.   Her Past Medical History Is Significant For: Past Medical History  Diagnosis Date  . No pertinent past medical history   . Mental disorders of mother, antepartum  depression  . Nausea alone   . Migraine, unspecified, without mention of intractable migraine without mention of status migrainosus   . Other specified maternal infectious and parasitic disease complicating pregnancy, childbirth, or the puerperium, unspecified as to episode of care     B strep  . PP care - s/p SVD 2/3 07/02/2011    Her Past Surgical History Is Significant For: Past Surgical History  Procedure Laterality Date  . Breast enhancement surgery    . Knee arthroscopy w/ acl reconstruction      left     Her Family History Is Significant For: Family History  Problem Relation Age of Onset  . Anesthesia problems Neg Hx   . Heart disease Father   . Diabetes Maternal Grandmother   . Cancer Maternal Grandmother     melanoma  . Hypertension Maternal Grandmother   . Multiple sclerosis Maternal Grandfather     Her Social History Is Significant For: History   Social History  . Marital Status: Married    Spouse Name: N/A    Number of Children: N/A  . Years of Education: N/A   Social History Main Topics  . Smoking status: Never Smoker   . Smokeless tobacco: Never Used  . Alcohol Use: Yes     Comment: occ, none with preg  . Drug Use: No  . Sexual Activity: Yes   Other Topics Concern  . None   Social History Narrative  . None    Her Allergies Are:  No Known Allergies:   Her Current Medications Are:  Outpatient Encounter Prescriptions as of 08/27/2013  Medication Sig  . fluticasone (FLONASE) 50 MCG/ACT nasal spray Place 2 sprays into both nostrils daily.  Marland Kitchen LORazepam (ATIVAN) 2 MG tablet Take 2 mg by mouth at bedtime.  . moxifloxacin (AVELOX) 400 MG tablet Take 1 tablet by mouth daily.  Colleen Can FE 1/20 1-20 MG-MCG tablet Take 1 tablet by mouth daily.  . ondansetron (ZOFRAN) 8 MG tablet Take 1 tablet (8 mg total) by mouth every 8 (eight) hours as needed for nausea.  Marland Kitchen oseltamivir (TAMIFLU) 75 MG capsule Take 1 capsule (75 mg total) by mouth 2 (two) times daily.  . [DISCONTINUED] HYDROcodone-homatropine (HYCODAN) 5-1.5 MG/5ML syrup Take 5 mLs by mouth every 8 (eight) hours as needed for cough.  . [DISCONTINUED] Prenatal Vit-Fe Fumarate-FA (PRENATAL MULTIVITAMIN) TABS Take 1 tablet by mouth daily.    . [DISCONTINUED] pseudoephedrine (SUDAFED) 30 MG tablet Take 30 mg by mouth every 4 (four) hours as needed. For cold symptoms  :  Review of Systems:  Out of a complete 14 point review of systems, all are reviewed and negative with the exception of these symptoms as listed  below:  Review of Systems  Constitutional: Positive for appetite change and fatigue.  HENT: Negative.   Eyes: Negative.   Cardiovascular: Positive for chest pain.  Gastrointestinal: Positive for constipation.  Endocrine: Positive for cold intolerance.  Genitourinary: Negative.   Musculoskeletal: Negative.   Skin: Negative.   Allergic/Immunologic: Positive for environmental allergies and immunocompromised state (frequent infections).  Neurological: Positive for numbness.       Memory loss  Hematological: Bruises/bleeds easily.  Psychiatric/Behavioral: Positive for sleep disturbance (Insomnia, e.d.s., ). The patient is nervous/anxious.     Objective:  Neurologic Exam  Physical Exam Physical Examination:   Filed Vitals:   08/27/13 1051  BP: 118/85  Pulse: 94  Temp: 99.4 F (37.4 C)    General Examination: The patient is a very pleasant  33 y.o. female in no acute distress. She appears well-developed and well-nourished and very well groomed.   HEENT: Normocephalic, atraumatic, pupils are equal, round and reactive to light and accommodation. Funduscopic exam is normal with sharp disc margins noted. Extraocular tracking is good without limitation to gaze excursion or nystagmus noted. Normal smooth pursuit is noted. Hearing is grossly intact. Tympanic membranes are clear bilaterally. Face is symmetric with normal facial animation and normal facial sensation. Speech is clear with no dysarthria noted. There is no hypophonia. There is no lip, neck/head, jaw or voice tremor. Neck is supple with full range of passive and active motion. There are no carotid bruits on auscultation. Oropharynx exam reveals: mild mouth dryness, good dental hygiene and no significant airway crowding. Mallampati is class I. Tongue protrudes centrally and palate elevates symmetrically. Tonsils are 1+. Neck size is 12.5 inches.   Chest: Clear to auscultation without wheezing, rhonchi or crackles noted.  Heart:  S1+S2+0, regular and normal without murmurs, rubs or gallops noted.   Abdomen: Soft, non-tender and non-distended with normal bowel sounds appreciated on auscultation.  Extremities: There is no pitting edema in the distal lower extremities bilaterally. Pedal pulses are intact.  Skin: Warm and dry without trophic changes noted. There are no varicose veins.  Musculoskeletal: exam reveals no obvious joint deformities, tenderness or joint swelling or erythema.   Neurologically:  Mental status: The patient is awake, alert and oriented in all 4 spheres. Her immediate and remote memory, attention, language skills and fund of knowledge are appropriate. There is no evidence of aphasia, agnosia, apraxia or anomia. Speech is clear with normal prosody and enunciation. Thought process is linear. Mood is normal and affect is normal.  Cranial nerves II - XII are as described above under HEENT exam. In addition: shoulder shrug is normal with equal shoulder height noted. Motor exam: Normal bulk, strength and tone is noted. There is no drift, tremor or rebound. Romberg is negative. Reflexes are 2+ throughout. Babinski: Toes are flexor bilaterally. Fine motor skills and coordination: intact with normal finger taps, normal hand movements, normal rapid alternating patting, normal foot taps and normal foot agility.  Cerebellar testing: No dysmetria or intention tremor on finger to nose testing. Heel to shin is unremarkable bilaterally. There is no truncal or gait ataxia.  Sensory exam: intact to light touch, pinprick, vibration, temperature sense and proprioception in the upper and lower extremities.  Gait, station and balance: She stands easily. No veering to one side is noted. No leaning to one side is noted. Posture is age-appropriate and stance is narrow based. Gait shows normal stride length and normal pace. No problems turning are noted. She turns en bloc. Tandem walk is unremarkable. Intact toe and heel stance is  noted.               Assessment and Plan:   In summary, Roni Friberg is a very pleasant 33 y.o.-year old female with an underlying medical history of allergies, and insomnia, who has had recurrent infections recently and complains of poor quality sleep, poor sleep consolidation, restless sleep, waking up tired and poorly rested, and daytime tiredness. Her exam is nonfocal and I reassured her in that regard. To further help delineate her underlying sleep related issues I would suggest a baseline sleep study. I explained the sleep test procedure to her. She was in agreement. I will see her back afterwards. She is advised to continue the medication she's currently taking at night to keep things stable. She  also endorses mild restless leg symptoms. She is not sure if she kicks and her sleep as her husband has never complained but he is a very deep sleeper. At this juncture, I have advised her that I will see her back after the sleep study and we will take it from there.  Thank you very much for allowing me to participate in the care of this nice patient. If I can be of any further assistance to you please do not hesitate to call me at [phone].  Sincerely,   Huston Foley, MD, PhD

## 2013-09-01 ENCOUNTER — Ambulatory Visit (INDEPENDENT_AMBULATORY_CARE_PROVIDER_SITE_OTHER): Payer: BC Managed Care – PPO

## 2013-09-01 DIAGNOSIS — G479 Sleep disorder, unspecified: Secondary | ICD-10-CM

## 2013-09-01 DIAGNOSIS — IMO0002 Reserved for concepts with insufficient information to code with codable children: Secondary | ICD-10-CM

## 2013-09-16 ENCOUNTER — Telehealth: Payer: Self-pay | Admitting: Neurology

## 2013-09-16 NOTE — Telephone Encounter (Signed)
Please call and notify the patient that the recent sleep study did not show any significant obstructive sleep apnea. Please inform patient that I would like to go over the details of the study during a follow up appointment and if not already previously scheduled, arrange a followup appointment (please utilize a followu-up slot). Also, route or fax report to PCP and referring MD, if other than PCP.  Once you have spoken to patient, you can close this encounter.   Thanks,  Scarlet Abad, MD, PhD [facility] ([facility]  

## 2013-09-17 ENCOUNTER — Encounter: Payer: Self-pay | Admitting: *Deleted

## 2013-09-17 ENCOUNTER — Other Ambulatory Visit: Payer: Self-pay | Admitting: *Deleted

## 2013-09-17 DIAGNOSIS — G478 Other sleep disorders: Secondary | ICD-10-CM

## 2013-09-17 DIAGNOSIS — J0301 Acute recurrent streptococcal tonsillitis: Secondary | ICD-10-CM

## 2013-09-17 DIAGNOSIS — R400 Somnolence: Secondary | ICD-10-CM

## 2013-09-17 DIAGNOSIS — G479 Sleep disorder, unspecified: Secondary | ICD-10-CM

## 2013-09-17 DIAGNOSIS — Z8709 Personal history of other diseases of the respiratory system: Secondary | ICD-10-CM

## 2013-09-17 DIAGNOSIS — A084 Viral intestinal infection, unspecified: Secondary | ICD-10-CM

## 2013-09-17 NOTE — Telephone Encounter (Signed)
I called and spoke with the patient about her recent sleep study results. I informed the patient that the study did not show any significant obstructive sleep apnea. I also informed the patient that Dr. Frances FurbishAthar would like to discuss the sleep study in details. Patient has been scheduled for her follow up visit with Dr. Frances FurbishAthar on May 4,2015 at 10:30 with an arrival time of 10:15 am. I will fax a copy of the report to Dr. Carolyn Stareamachandran's office and mail a copy of the report to the patient.

## 2013-09-28 ENCOUNTER — Institutional Professional Consult (permissible substitution): Payer: BC Managed Care – PPO | Admitting: Neurology

## 2013-09-30 ENCOUNTER — Ambulatory Visit (INDEPENDENT_AMBULATORY_CARE_PROVIDER_SITE_OTHER): Payer: BC Managed Care – PPO | Admitting: Neurology

## 2013-09-30 ENCOUNTER — Encounter: Payer: Self-pay | Admitting: Neurology

## 2013-09-30 VITALS — BP 104/86 | HR 72 | Temp 98.8°F | Ht 65.0 in | Wt 122.0 lb

## 2013-09-30 DIAGNOSIS — G479 Sleep disorder, unspecified: Secondary | ICD-10-CM

## 2013-09-30 DIAGNOSIS — G4700 Insomnia, unspecified: Secondary | ICD-10-CM

## 2013-09-30 DIAGNOSIS — G478 Other sleep disorders: Secondary | ICD-10-CM

## 2013-09-30 NOTE — Progress Notes (Signed)
Subjective:    Patient ID: Stacie Lopez is a 33 y.o. female.  HPI    Interim history:   Stacie Lopez is a 33 year old right-handed woman with an underlying medical history of allergies, insomnia, and recurrent sore throat, who presents for followup consultation after a recent sleep study. She is unaccompanied today. I first met her at the request of her primary care physician on 08/27/2013, at which time she reported recurrent sore throat and complains of excessive daytime tiredness since December 2014. She also reported trouble falling asleep. She has been taking Ambien. She had recently started taking lorazepam as needed as well as melatonin. She reported getting only about 4-5 hours of sleep, which caused her stress. She reported some forgetfulness and mood irritability. I suggested she return for sleep study. She had a baseline sleep study on 09/01/2013 and went over her test results with her in detail today. Sleep efficiency was normal at 93.8% with a latency to sleep of 14.5 minutes and wake after sleep onset of 10.5 minutes with minimal sleep fragmentation noted. The arousal index was normal at 6 arousals per hour. She had a normal percentage of stage I sleep at a markedly increased percentage of stage II sleep at 81.6%, absence of slow-wave sleep and a decreased percentage of REM sleep at 12.9% with a normal REM latency of 99.5 minutes. She had no significant periodic leg movements of sleep and no clinically significant EKG changes. She had no significant snoring. Her AHI was 0 per hour. Baseline oxygen saturation was 96%, nadir was reported as 83% but  graft in reviewing her oxygen saturations this appeared to be a artifact. Her true nadir appeared to be 88%.  Today, she reports, that she has reduced her caffeine intake, and she recalls, sleeping very well during the sleep study. She actually stopped caffeine altogether for about 2 weeks and her fit bit indicated that she slept better and she also  feels that she slept a little bit better when she was completely off of caffeine. She is off Trazodone, Ambien, and Ativan. She did not respond to them. She thinks Melatonin helped fairly well. She has not been sick lately. She feels well today. She recently had stomach virus. She lost about 6 lb.   Her Past Medical History Is Significant For: Past Medical History  Diagnosis Date  . No pertinent past medical history   . Mental disorders of mother, antepartum     depression  . Nausea alone   . Migraine, unspecified, without mention of intractable migraine without mention of status migrainosus   . Other specified maternal infectious and parasitic disease complicating pregnancy, childbirth, or the puerperium, unspecified as to episode of care     B strep  . PP care - s/p SVD 2/3 07/02/2011    Her Past Surgical History Is Significant For: Past Surgical History  Procedure Laterality Date  . Breast enhancement surgery    . Knee arthroscopy w/ acl reconstruction      left    Her Family History Is Significant For: Family History  Problem Relation Age of Onset  . Anesthesia problems Neg Hx   . Heart disease Father   . Diabetes Maternal Grandmother   . Cancer Maternal Grandmother     melanoma  . Hypertension Maternal Grandmother   . Multiple sclerosis Maternal Grandfather     Her Social History Is Significant For: History   Social History  . Marital Status: Married    Spouse Name: N/A  Number of Children: N/A  . Years of Education: N/A   Social History Main Topics  . Smoking status: Never Smoker   . Smokeless tobacco: Never Used  . Alcohol Use: Yes     Comment: occ, none with preg  . Drug Use: No  . Sexual Activity: Yes   Other Topics Concern  . None   Social History Narrative  . None    Her Allergies Are:  No Known Allergies:   Her Current Medications Are:  Outpatient Encounter Prescriptions as of 09/30/2013  Medication Sig  . fluticasone (FLONASE) 50 MCG/ACT  nasal spray Place 2 sprays into both nostrils daily.  Marland Kitchen LORazepam (ATIVAN) 2 MG tablet Take 2 mg by mouth at bedtime.  . moxifloxacin (AVELOX) 400 MG tablet Take 1 tablet by mouth daily.  . ondansetron (ZOFRAN) 8 MG tablet Take 1 tablet (8 mg total) by mouth every 8 (eight) hours as needed for nausea.  . traZODone (DESYREL) 50 MG tablet Take 1 tablet by mouth at bedtime as needed and may repeat dose one time if needed.  Stacie Lopez FE 1/20 1-20 MG-MCG tablet Take 1 tablet by mouth daily.  . [DISCONTINUED] oseltamivir (TAMIFLU) 75 MG capsule Take 1 capsule (75 mg total) by mouth 2 (two) times daily.  :  Review of Systems:  Out of a complete 14 point review of systems, all are reviewed and negative with the exception of these symptoms as listed below: Review of Systems  Constitutional: Positive for fatigue.  HENT: Negative.   Eyes: Negative.   Respiratory: Negative.   Cardiovascular: Negative.   Gastrointestinal: Negative.   Endocrine: Negative.   Genitourinary: Negative.   Musculoskeletal: Negative.   Skin: Negative.   Allergic/Immunologic: Positive for environmental allergies.  Neurological: Negative.   Hematological: Negative.   Psychiatric/Behavioral: Positive for sleep disturbance (e.d.s.).    Objective:  Neurologic Exam  Physical Exam Physical Examination:   Filed Vitals:   09/30/13 0957  BP: 104/86  Pulse: 72  Temp: 98.8 F (37.1 C)    General Examination: The patient is a very pleasant 33 y.o. female in no acute distress. She appears well-developed and well-nourished and very well groomed. She is slender.   HEENT: Normocephalic, atraumatic, pupils are equal, round and reactive to light and accommodation. Funduscopic exam is normal with sharp disc margins noted. Extraocular tracking is good without limitation to gaze excursion or nystagmus noted. Normal smooth pursuit is noted. Hearing is grossly intact. Face is symmetric with normal facial animation and normal facial  sensation. Speech is clear with no dysarthria noted. There is no hypophonia. There is no lip, neck/head, jaw or voice tremor. Neck is supple with full range of passive and active motion. There are no carotid bruits on auscultation. Oropharynx exam reveals: mild mouth dryness, good dental hygiene and no significant airway crowding. Mallampati is class I. Tongue protrudes centrally and palate elevates symmetrically. Tonsils are 1+.   Chest: Clear to auscultation without wheezing, rhonchi or crackles noted.  Heart: S1+S2+0, regular and normal without murmurs, rubs or gallops noted.   Abdomen: Soft, non-tender and non-distended with normal bowel sounds appreciated on auscultation.  Extremities: There is no pitting edema in the distal lower extremities bilaterally. Pedal pulses are intact.  Skin: Warm and dry without trophic changes noted. There are no varicose veins.  Musculoskeletal: exam reveals no obvious joint deformities, tenderness or joint swelling or erythema.   Neurologically:  Mental status: The patient is awake, alert and oriented in all 4 spheres. Her  immediate and remote memory, attention, language skills and fund of knowledge are appropriate. There is no evidence of aphasia, agnosia, apraxia or anomia. Speech is clear with normal prosody and enunciation. Thought process is linear. Mood is normal and affect is normal.  Cranial nerves II - XII are as described above under HEENT exam. In addition: shoulder shrug is normal with equal shoulder height noted. Motor exam: Normal bulk, strength and tone is noted. There is no drift, tremor or rebound. Romberg is negative. Reflexes are 2+ throughout. Fine motor skills and coordination: intact.  Cerebellar testing: There is no truncal or gait ataxia.  Sensory exam: intact to light touch.  Gait, station and balance: She stands easily. No veering to one side is noted. No leaning to one side is noted. Posture is age-appropriate and stance is narrow  based. Gait shows normal stride length and normal pace. No problems turning are noted. She turns en bloc. Tandem walk is unremarkable. Intact toe and heel stance is noted.              Assessment and Plan:   In summary, Coila Wardell is a very pleasant 33 year old female with an underlying medical history of allergies, and insomnia, who has had recurrent infections recently and complains of poor quality sleep, poor sleep consolidation, and her recent sleep study was in fact benign. While she did have an increase in stage II sleep and absence of deep sleep as well as reduction of dream sleep this was most likely a medication effect because she took Ativan. She has done a little bit better lately when she came off of caffeine altogether. She is advised that she has no underlying intrinsic sleep disorder in particular, no evidence of OSA. Her exam is nonfocal. I would recommend that she try eliminating caffeine or at least avoiding and after 2 PM. She did feel that melatonin had helped in the past but she only took 1-2 mg and I suggested that she could try melatonin  3 to up to 5 mg, one to 2 hours before her projected bedtime. We talked again about improving sleep hygiene and avoiding exercising close to bedtime. At this juncture, I can see her back on an as-needed basis. The patient was in agreement.

## 2013-09-30 NOTE — Patient Instructions (Signed)
  You can try Melatonin at night for sleep: take 3 to 5 mg one hour before your bedtime.    Avoid caffeine after 2 PM.   I will see you back as needed.    You do not have sleep apnea.

## 2014-03-29 ENCOUNTER — Encounter: Payer: Self-pay | Admitting: Neurology

## 2014-08-05 ENCOUNTER — Other Ambulatory Visit: Payer: Self-pay | Admitting: Otolaryngology

## 2014-08-05 ENCOUNTER — Ambulatory Visit
Admission: RE | Admit: 2014-08-05 | Discharge: 2014-08-05 | Disposition: A | Discharge status: Home / Self Care | Payer: BLUE CROSS/BLUE SHIELD | Source: Ambulatory Visit | Attending: Otolaryngology | Admitting: Otolaryngology

## 2014-08-05 ENCOUNTER — Other Ambulatory Visit: Payer: Self-pay

## 2014-08-05 DIAGNOSIS — R52 Pain, unspecified: Secondary | ICD-10-CM

## 2014-08-05 DIAGNOSIS — R6884 Jaw pain: Secondary | ICD-10-CM

## 2016-05-23 IMAGING — CR DG ABDOMEN 1V
1 series · 1 of 1 positions shown · non-contrast
Comparison: None.

CLINICAL DATA: Left lower quadrant pain.

EXAM:
ABDOMEN - 1 VIEW

[t abdomen supine *]
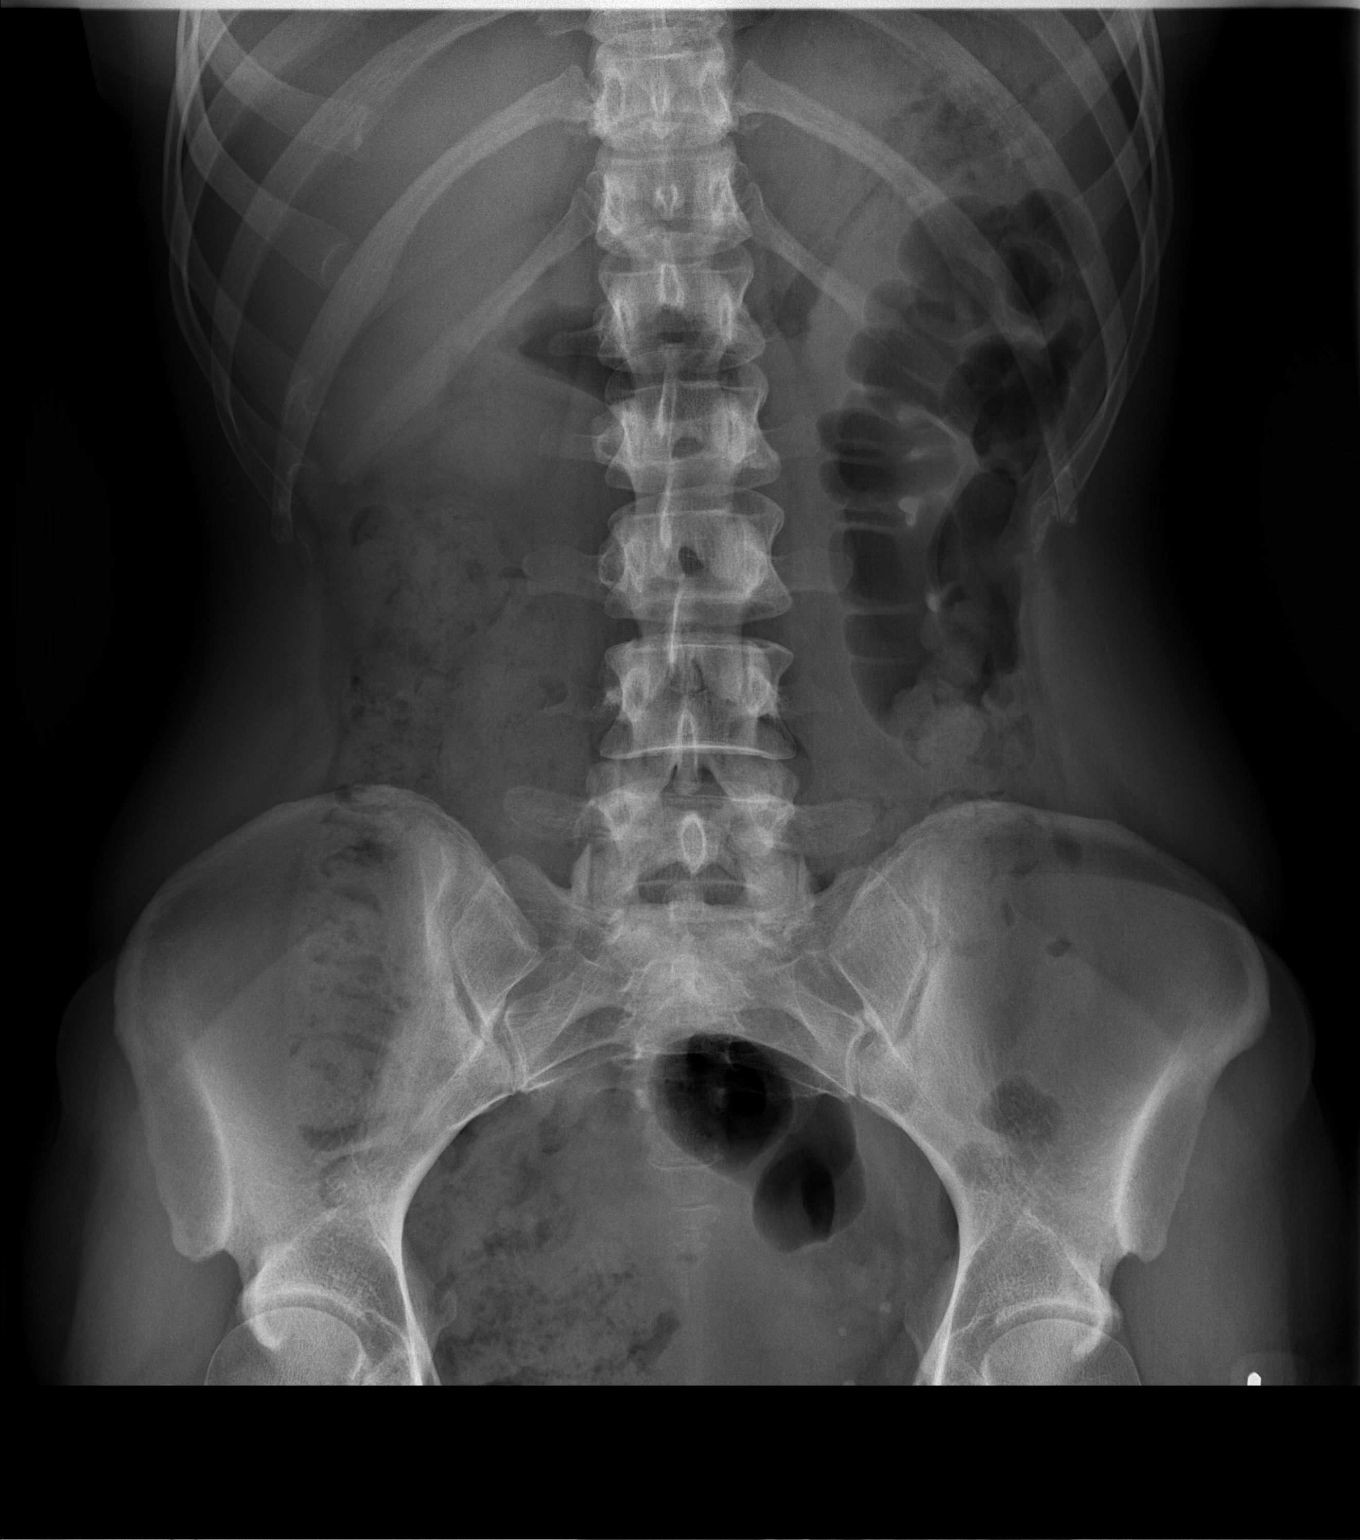

[1 of 1 positions shown; findings below may reference images not displayed]

FINDINGS: Soft tissue structures are unremarkable. No bowel distention or free
air. Moderate amount stool noted throughout the colon . No acute
bony abnormality. Pelvic calcifications system phleboliths 2
IMPRESSION: No acute abnormality. Mild amount of stool noted throughout the
colon. No bowel distention.

## 2016-05-23 IMAGING — CT CT PARANASAL SINUSES LIMITED
1 series · 9 of 11 positions shown, 12 images · non-contrast
Comparison: None.

CLINICAL DATA: Sinus congestion, frontal sinus pain. Symptoms since
March 2014

EXAM:
CT PARANASAL SINUS LIMITED WITHOUT CONTRAST
TECHNIQUE: Non-contiguous multidetector CT images of the paranasal sinuses were
obtained in a single plane without contrast.

[Series 3: coronal soft · axial · 0.33mm/px · z∈[+26,+106]mm · 9 of 11 slices shown, 12 images]
[im 2/11  brain]
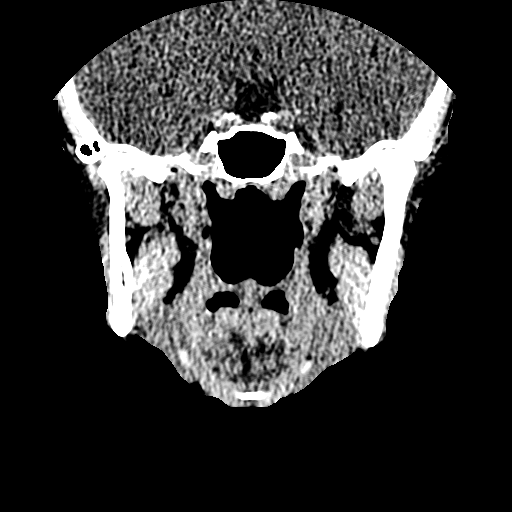
[im 2/11  bone]
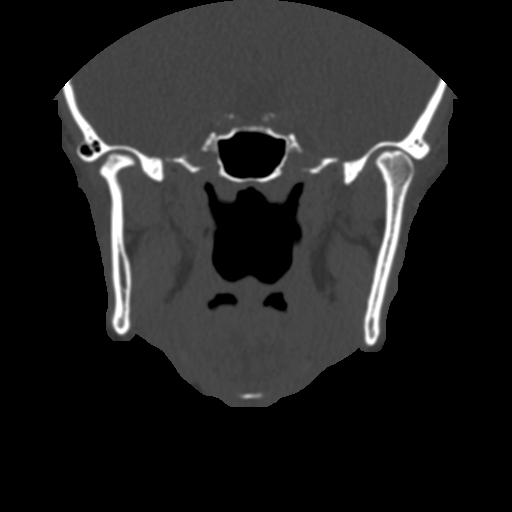
[im 3/11  bone]
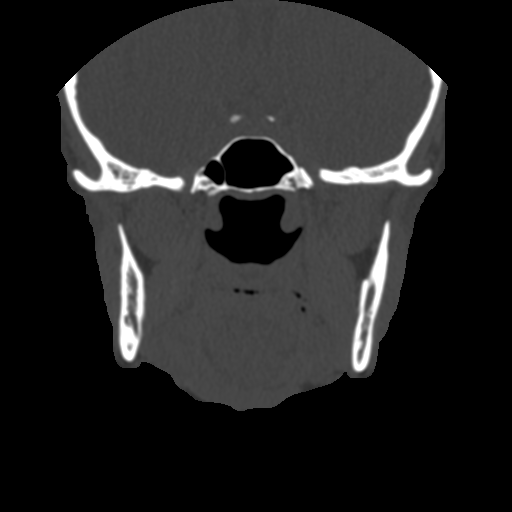
[im 4/11  bone]
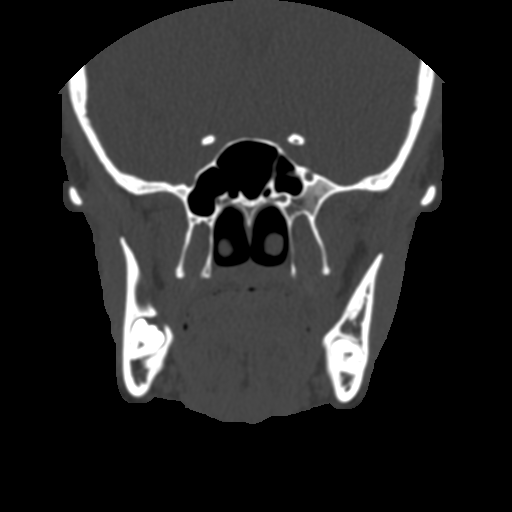
[im 5/11  bone]
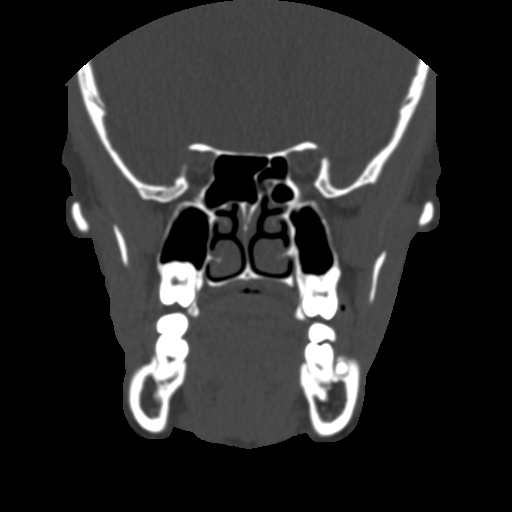
[im 6/11  brain]
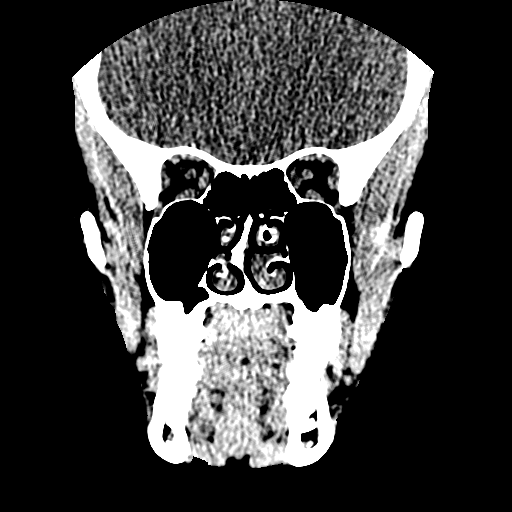
[im 6/11  bone]
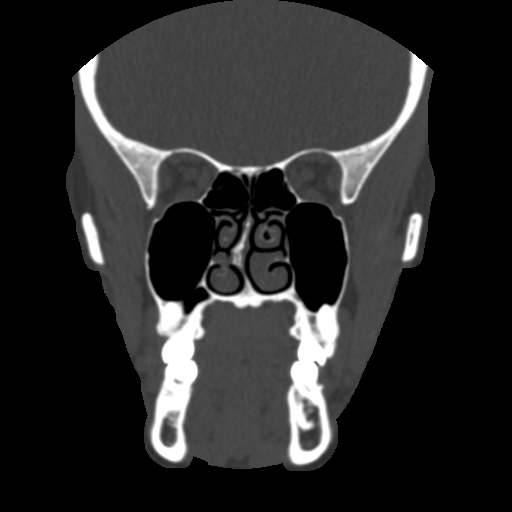
[im 7/11  bone]
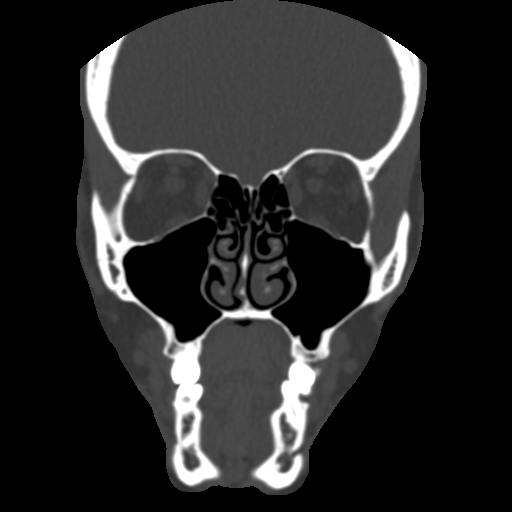
[im 8/11  bone]
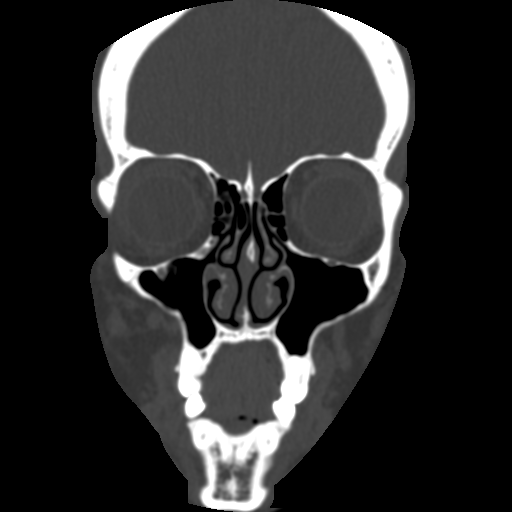
[im 9/11  bone]
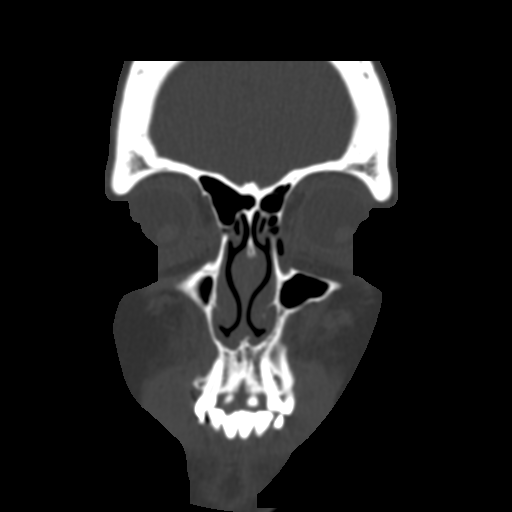
[im 10/11  brain]
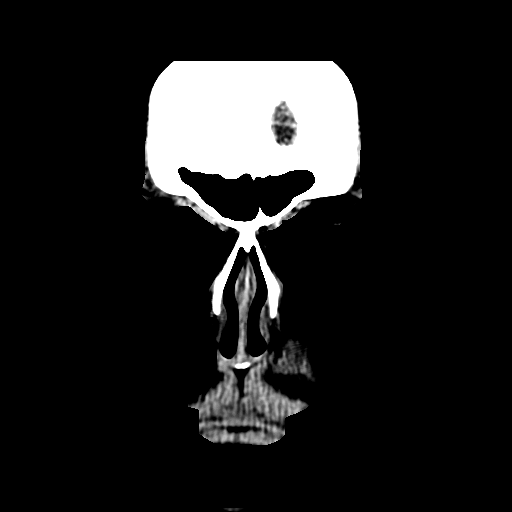
[im 10/11  bone]
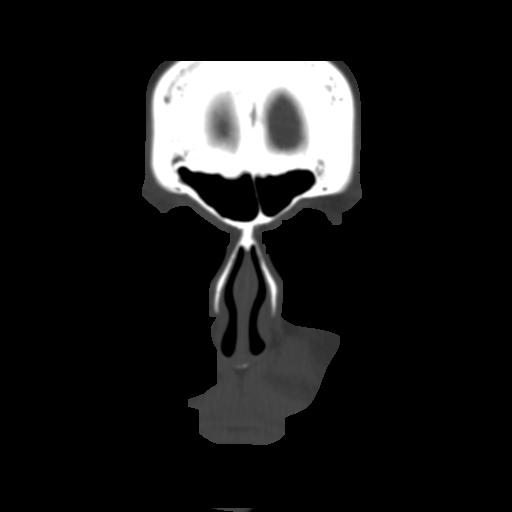

[9 of 11 positions shown; findings below may reference images not displayed]

FINDINGS: No mucosal inflammation in the maxillary sinuses. The ostiomeatal
complexes are patent. Ethmoid air cells are clear. Sphenoid sinuses
and frontal sinuses are clear. No paranasal sinus bone sclerosis.
IMPRESSION: No evidence of acute or chronic sinus inflammation.

## 2016-12-25 LAB — OB RESULTS CONSOLE RUBELLA ANTIBODY, IGM: Rubella: IMMUNE

## 2016-12-25 LAB — OB RESULTS CONSOLE RPR: RPR: NONREACTIVE

## 2016-12-25 LAB — OB RESULTS CONSOLE ABO/RH: RH Type: POSITIVE

## 2016-12-25 LAB — OB RESULTS CONSOLE ANTIBODY SCREEN: ANTIBODY SCREEN: NEGATIVE

## 2016-12-25 LAB — OB RESULTS CONSOLE HIV ANTIBODY (ROUTINE TESTING): HIV: NONREACTIVE

## 2016-12-25 LAB — OB RESULTS CONSOLE HEPATITIS B SURFACE ANTIGEN: Hepatitis B Surface Ag: NEGATIVE

## 2017-01-08 LAB — OB RESULTS CONSOLE GC/CHLAMYDIA: Gonorrhea: NEGATIVE

## 2017-01-08 LAB — OB RESULTS CONSOLE GBS: GBS: NEGATIVE

## 2017-05-28 NOTE — L&D Delivery Note (Signed)
Delivery Note At 8:06 AM a viable and healthy female was delivered via Vaginal, Spontaneous (Presentation:9,9 ).  APGAR: 9, 9; weight  pending.   Placenta status: spontaneous, intact.  Cord:  with the following complications: none .  Cord pH: na  Anesthesia:  epidural Episiotomy: None Lacerations: 2nd degree;Perineal Suture Repair: 2.0 vicryl rapide Est. Blood Loss (mL):  100  Mom to postpartum.  Baby to Couplet care / Skin to Skin.  Branae Crail J 07/31/2017, 8:22 AM

## 2017-07-03 LAB — OB RESULTS CONSOLE GC/CHLAMYDIA: Chlamydia: NEGATIVE

## 2017-07-18 ENCOUNTER — Telehealth (HOSPITAL_COMMUNITY): Payer: Self-pay | Admitting: *Deleted

## 2017-07-18 ENCOUNTER — Encounter (HOSPITAL_COMMUNITY): Payer: Self-pay | Admitting: *Deleted

## 2017-07-18 NOTE — Telephone Encounter (Signed)
Preadmission screen  

## 2017-07-23 ENCOUNTER — Other Ambulatory Visit: Payer: Self-pay | Admitting: Obstetrics and Gynecology

## 2017-07-31 ENCOUNTER — Inpatient Hospital Stay (HOSPITAL_COMMUNITY)
Admission: AD | Admit: 2017-07-31 | Discharge: 2017-08-01 | DRG: 807 | Disposition: A | Discharge status: Home / Self Care | Payer: 59 | Source: Ambulatory Visit | Attending: Obstetrics and Gynecology | Admitting: Obstetrics and Gynecology

## 2017-07-31 ENCOUNTER — Inpatient Hospital Stay (HOSPITAL_COMMUNITY): Payer: 59 | Admitting: Anesthesiology

## 2017-07-31 ENCOUNTER — Encounter (HOSPITAL_COMMUNITY): Payer: Self-pay

## 2017-07-31 DIAGNOSIS — Z3A39 39 weeks gestation of pregnancy: Secondary | ICD-10-CM | POA: Diagnosis not present

## 2017-07-31 DIAGNOSIS — Z3483 Encounter for supervision of other normal pregnancy, third trimester: Secondary | ICD-10-CM | POA: Diagnosis present

## 2017-07-31 LAB — CBC
HCT: 38.4 % (ref 36.0–46.0)
HEMOGLOBIN: 13.3 g/dL (ref 12.0–15.0)
MCH: 30.8 pg (ref 26.0–34.0)
MCHC: 34.6 g/dL (ref 30.0–36.0)
MCV: 88.9 fL (ref 78.0–100.0)
Platelets: 175 10*3/uL (ref 150–400)
RBC: 4.32 MIL/uL (ref 3.87–5.11)
RDW: 13.4 % (ref 11.5–15.5)
WBC: 8.2 10*3/uL (ref 4.0–10.5)

## 2017-07-31 LAB — TYPE AND SCREEN
ABO/RH(D): A POS
Antibody Screen: NEGATIVE

## 2017-07-31 LAB — ABO/RH: ABO/RH(D): A POS

## 2017-07-31 LAB — RPR: RPR: NONREACTIVE

## 2017-07-31 MED ORDER — PHENYLEPHRINE 40 MCG/ML (10ML) SYRINGE FOR IV PUSH (FOR BLOOD PRESSURE SUPPORT)
80.0000 ug | PREFILLED_SYRINGE | INTRAVENOUS | Status: DC | PRN
  Filled 2017-07-31: qty 5
  Filled 2017-07-31: qty 10

## 2017-07-31 MED ORDER — PHENYLEPHRINE 40 MCG/ML (10ML) SYRINGE FOR IV PUSH (FOR BLOOD PRESSURE SUPPORT)
80.0000 ug | PREFILLED_SYRINGE | INTRAVENOUS | Status: DC | PRN
  Filled 2017-07-31: qty 5

## 2017-07-31 MED ORDER — SOD CITRATE-CITRIC ACID 500-334 MG/5ML PO SOLN: 30.0000 mL | ORAL | Status: DC | PRN

## 2017-07-31 MED ORDER — SIMETHICONE 80 MG PO CHEW: 80.0000 mg | CHEWABLE_TABLET | ORAL | Status: DC | PRN

## 2017-07-31 MED ORDER — DIPHENHYDRAMINE HCL 25 MG PO CAPS: 25.0000 mg | ORAL_CAPSULE | Freq: Four times a day (QID) | ORAL | Status: DC | PRN

## 2017-07-31 MED ORDER — IBUPROFEN 600 MG PO TABS
600.0000 mg | ORAL_TABLET | Freq: Four times a day (QID) | ORAL | Status: DC
  Administered 2017-07-31 – 2017-08-01 (×5): 600 mg via ORAL
  Filled 2017-07-31 (×4): qty 1

## 2017-07-31 MED ORDER — ACETAMINOPHEN 325 MG PO TABS
650.0000 mg | ORAL_TABLET | ORAL | Status: DC | PRN
  Administered 2017-07-31 – 2017-08-01 (×4): 650 mg via ORAL
  Filled 2017-07-31 (×4): qty 2

## 2017-07-31 MED ORDER — LACTATED RINGERS IV SOLN
500.0000 mL | Freq: Once | INTRAVENOUS | Status: AC
  Administered 2017-07-31: 500 mL via INTRAVENOUS

## 2017-07-31 MED ORDER — SENNOSIDES-DOCUSATE SODIUM 8.6-50 MG PO TABS
2.0000 | ORAL_TABLET | ORAL | Status: DC
  Administered 2017-08-01: 2 via ORAL
  Filled 2017-07-31: qty 2

## 2017-07-31 MED ORDER — DIPHENHYDRAMINE HCL 50 MG/ML IJ SOLN: 12.5000 mg | INTRAMUSCULAR | Status: DC | PRN

## 2017-07-31 MED ORDER — ONDANSETRON HCL 4 MG PO TABS: 4.0000 mg | ORAL_TABLET | ORAL | Status: DC | PRN

## 2017-07-31 MED ORDER — FLEET ENEMA 7-19 GM/118ML RE ENEM: 1.0000 | ENEMA | RECTAL | Status: DC | PRN

## 2017-07-31 MED ORDER — FENTANYL 2.5 MCG/ML BUPIVACAINE 1/10 % EPIDURAL INFUSION (WH - ANES)
14.0000 mL/h | INTRAMUSCULAR | Status: DC | PRN
  Administered 2017-07-31: 14 mL/h via EPIDURAL
  Filled 2017-07-31: qty 100

## 2017-07-31 MED ORDER — LACTATED RINGERS IV SOLN: 500.0000 mL | INTRAVENOUS | Status: DC | PRN

## 2017-07-31 MED ORDER — ONDANSETRON HCL 4 MG/2ML IJ SOLN: 4.0000 mg | INTRAMUSCULAR | Status: DC | PRN

## 2017-07-31 MED ORDER — TETANUS-DIPHTH-ACELL PERTUSSIS 5-2.5-18.5 LF-MCG/0.5 IM SUSP: 0.5000 mL | Freq: Once | INTRAMUSCULAR | Status: DC

## 2017-07-31 MED ORDER — OXYTOCIN BOLUS FROM INFUSION
500.0000 mL | Freq: Once | INTRAVENOUS | Status: AC
  Administered 2017-07-31: 500 mL via INTRAVENOUS

## 2017-07-31 MED ORDER — OXYCODONE-ACETAMINOPHEN 5-325 MG PO TABS: 2.0000 | ORAL_TABLET | ORAL | Status: DC | PRN

## 2017-07-31 MED ORDER — DIBUCAINE 1 % RE OINT: 1.0000 "application " | TOPICAL_OINTMENT | RECTAL | Status: DC | PRN

## 2017-07-31 MED ORDER — ONDANSETRON HCL 4 MG/2ML IJ SOLN
4.0000 mg | Freq: Four times a day (QID) | INTRAMUSCULAR | Status: DC | PRN
  Administered 2017-07-31: 4 mg via INTRAVENOUS
  Filled 2017-07-31: qty 2

## 2017-07-31 MED ORDER — EPHEDRINE 5 MG/ML INJ
10.0000 mg | INTRAVENOUS | Status: DC | PRN
  Filled 2017-07-31: qty 2

## 2017-07-31 MED ORDER — LIDOCAINE HCL (PF) 1 % IJ SOLN
30.0000 mL | INTRAMUSCULAR | Status: DC | PRN
  Filled 2017-07-31: qty 30

## 2017-07-31 MED ORDER — BENZOCAINE-MENTHOL 20-0.5 % EX AERO: 1.0000 "application " | INHALATION_SPRAY | CUTANEOUS | Status: DC | PRN

## 2017-07-31 MED ORDER — ACETAMINOPHEN 325 MG PO TABS: 650.0000 mg | ORAL_TABLET | ORAL | Status: DC | PRN

## 2017-07-31 MED ORDER — LACTATED RINGERS IV SOLN: 500.0000 mL | Freq: Once | INTRAVENOUS | Status: DC

## 2017-07-31 MED ORDER — OXYTOCIN 40 UNITS IN LACTATED RINGERS INFUSION - SIMPLE MED
2.5000 [IU]/h | INTRAVENOUS | Status: DC
  Filled 2017-07-31: qty 1000

## 2017-07-31 MED ORDER — LIDOCAINE HCL (PF) 1 % IJ SOLN
INTRAMUSCULAR | Status: DC | PRN
  Administered 2017-07-31: 6 mL via EPIDURAL

## 2017-07-31 MED ORDER — LACTATED RINGERS IV SOLN
INTRAVENOUS | Status: DC
  Administered 2017-07-31: 04:00:00 via INTRAVENOUS

## 2017-07-31 MED ORDER — PRENATAL MULTIVITAMIN CH
1.0000 | ORAL_TABLET | Freq: Every day | ORAL | Status: DC
  Administered 2017-07-31 – 2017-08-01 (×2): 1 via ORAL
  Filled 2017-07-31: qty 1

## 2017-07-31 MED ORDER — COCONUT OIL OIL: 1.0000 "application " | TOPICAL_OIL | Status: DC | PRN

## 2017-07-31 MED ORDER — WITCH HAZEL-GLYCERIN EX PADS: 1.0000 "application " | MEDICATED_PAD | CUTANEOUS | Status: DC | PRN

## 2017-07-31 MED ORDER — OXYCODONE-ACETAMINOPHEN 5-325 MG PO TABS: 1.0000 | ORAL_TABLET | ORAL | Status: DC | PRN

## 2017-07-31 NOTE — Anesthesia Procedure Notes (Signed)
Epidural Patient location during procedure: OB Start time: 07/31/2017 5:00 AM End time: 07/31/2017 5:10 AM  Staffing Anesthesiologist: Trevor IhaHouser, Neeko Pharo A, MD Performed: anesthesiologist   Preanesthetic Checklist Completed: patient identified, site marked, surgical consent, pre-op evaluation, timeout performed, IV checked, risks and benefits discussed and monitors and equipment checked  Epidural Patient position: sitting Prep: site prepped and draped and DuraPrep Patient monitoring: continuous pulse ox and blood pressure Approach: midline Location: L3-L4 Injection technique: LOR air  Needle:  Needle type: Tuohy  Needle gauge: 17 G Needle length: 9 cm and 9 Needle insertion depth: 5 cm cm Catheter type: closed end flexible Catheter size: 19 Gauge Catheter at skin depth: 10 cm Test dose: negative  Assessment Events: blood not aspirated, injection not painful, no injection resistance, negative IV test and no paresthesia

## 2017-07-31 NOTE — Anesthesia Postprocedure Evaluation (Signed)
Anesthesia Post Note  Patient: Stacie Lopez  Procedure(s) Performed: AN AD HOC LABOR EPIDURAL     Patient location during evaluation: Mother Baby Anesthesia Type: Epidural Level of consciousness: awake and alert Pain management: pain level controlled Vital Signs Assessment: post-procedure vital signs reviewed and stable Respiratory status: spontaneous breathing Cardiovascular status: blood pressure returned to baseline and stable Postop Assessment: no headache, epidural receding, patient able to bend at knees, adequate PO intake, no backache and no apparent nausea or vomiting Anesthetic complications: no    Last Vitals:  Vitals:   07/31/17 1039 07/31/17 1149  BP: 111/71 110/70  Pulse: 63 72  Resp: 18 16  Temp: 36.9 C 36.7 C  SpO2:      Last Pain:  Vitals:   07/31/17 1149  TempSrc: Oral  PainSc: 0-No pain   Pain Goal:                 Cleda ClarksBrowder, Kesler Wickham R

## 2017-07-31 NOTE — MAU Note (Signed)
Pt started having regular ctx's around 1800 07/30/2017, she states they been coming every 2-5 minutes for the last 20 minutes.  Pt states she is having bloody show. Denies LOF Reports good fetal movement

## 2017-07-31 NOTE — H&P (Signed)
Stacie Lopez is a 37 y.o. female presenting for labor. OB History    Gravida Para Term Preterm AB Living   3 2 2  0 0 2   SAB TAB Ectopic Multiple Live Births   0 0 0 0 2     Past Medical History:  Diagnosis Date  . Mental disorders of mother, antepartum    problems sleeping with pregnancy  . Migraine, unspecified, without mention of intractable migraine without mention of status migrainosus   . Nausea alone   . No pertinent past medical history   . Other specified maternal infectious and parasitic disease complicating pregnancy, childbirth, or the puerperium, unspecified as to episode of care(647.80)    B strep  . PP care - s/p SVD 2/3 07/02/2011   Past Surgical History:  Procedure Laterality Date  . BREAST ENHANCEMENT SURGERY    . EYE SURGERY     ;[place]  . KNEE ARTHROSCOPY W/ ACL RECONSTRUCTION     left   Family History: family history includes Cancer in her maternal grandmother; Diabetes in her maternal grandmother; Heart disease in her father; Hypertension in her maternal grandmother; Multiple sclerosis in her maternal grandfather. Social History:  reports that  has never smoked. she has never used smokeless tobacco. She reports that she drinks alcohol. She reports that she does not use drugs.     Maternal Diabetes: No Genetic Screening: Normal Maternal Ultrasounds/Referrals: Normal Fetal Ultrasounds or other Referrals:  None Maternal Substance Abuse:  No Significant Maternal Medications:  None Significant Maternal Lab Results:  None Other Comments:  None  Review of Systems  Constitutional: Negative.   All other systems reviewed and are negative.  Maternal Medical History:  Reason for admission: Contractions.   Contractions: Onset was 3-5 hours ago.   Frequency: regular.   Perceived severity is mild.    Fetal activity: Perceived fetal activity is normal.   Last perceived fetal movement was within the past hour.    Prenatal complications: no prenatal  complications Prenatal Complications - Diabetes: none.    Dilation: 7 Effacement (%): 90 Station: -1 Exam by:: Caprice Renshaw, RN  Blood pressure (!) 111/96, pulse 78, temperature 97.6 F (36.4 C), temperature source Oral, resp. rate 20, height 5\' 4"  (1.626 m), weight 77.7 kg (171 lb 6.4 oz), last menstrual period 11/12/2016, SpO2 100 %. Maternal Exam:  Uterine Assessment: Contraction strength is mild.  Contraction frequency is irregular.   Abdomen: Patient reports no abdominal tenderness. Fetal presentation: vertex  Introitus: Normal vulva. Normal vagina.  Ferning test: negative.  Nitrazine test: negative. Amniotic fluid character: not assessed.  Pelvis: adequate for delivery.   Cervix: Cervix evaluated by digital exam.     Physical Exam  Nursing note and vitals reviewed. Constitutional: She is oriented to person, place, and time. She appears well-developed and well-nourished.  HENT:  Head: Normocephalic and atraumatic.  Neck: Normal range of motion. Neck supple.  Cardiovascular: Normal rate and regular rhythm.  Respiratory: Effort normal and breath sounds normal.  GI: Soft. Bowel sounds are normal.  Genitourinary: Vagina normal and uterus normal.  Musculoskeletal: Normal range of motion.  Neurological: She is alert and oriented to person, place, and time. She has normal reflexes.  Skin: Skin is warm and dry.  Psychiatric: She has a normal mood and affect.    Prenatal labs: ABO, Rh: --/--/A POS (03/06 0401) Antibody: NEG (03/06 0401) Rubella: Immune (07/31 0000) RPR: Nonreactive (07/31 0000)  HBsAg: Negative (07/31 0000)  HIV: Non-reactive (07/31 0000)  GBS:  Negative (08/14 0000)   Assessment/Plan: Term IUP Active labor   Stacie Lopez 07/31/2017, 6:29 AM

## 2017-07-31 NOTE — Anesthesia Preprocedure Evaluation (Signed)
Anesthesia Evaluation  Patient identified by MRN, date of birth, ID band Patient awake    Reviewed: Allergy & Precautions, Patient's Chart, lab work & pertinent test results  Airway Mallampati: II  TM Distance: >3 FB Neck ROM: Full    Dental no notable dental hx.    Pulmonary neg pulmonary ROS,    Pulmonary exam normal breath sounds clear to auscultation       Cardiovascular negative cardio ROS Normal cardiovascular exam Rhythm:Regular Rate:Normal     Neuro/Psych neg Headaches,    GI/Hepatic   Endo/Other    Renal/GU      Musculoskeletal   Abdominal   Peds  Hematology   Anesthesia Other Findings   Reproductive/Obstetrics (+) Pregnancy                             Lab Results  Component Value Date   WBC 8.2 07/31/2017   HGB 13.3 07/31/2017   HCT 38.4 07/31/2017   MCV 88.9 07/31/2017   PLT 175 07/31/2017    Anesthesia Physical Anesthesia Plan  ASA: II  Anesthesia Plan: Epidural   Post-op Pain Management:    Induction:   PONV Risk Score and Plan:   Airway Management Planned:   Additional Equipment:   Intra-op Plan:   Post-operative Plan:   Informed Consent: I have reviewed the patients History and Physical, chart, labs and discussed the procedure including the risks, benefits and alternatives for the proposed anesthesia with the patient or authorized representative who has indicated his/her understanding and acceptance.     Plan Discussed with:   Anesthesia Plan Comments:         Anesthesia Quick Evaluation

## 2017-07-31 NOTE — Anesthesia Pain Management Evaluation Note (Signed)
  [facility] Pain Management Visit Note  Patient: Stacie Lopez, 37 y.o., female  "Hello I am a member of the anesthesia team at [facility]. We have an anesthesia team available at all times to provide care throughout the hospital, including epidural management and anesthesia for C-section. I don't know your plan for the delivery whether it a natural birth, water birth, IV sedation, nitrous supplementation, doula or epidural, but we want to meet your pain goals."   1.Was your pain managed to your expectations on prior hospitalizations?   No prior hospitalizations  2.What is your expectation for pain management during this hospitalization?     Epidural  3.How can we help you reach that goal? epidural  Record the patient's initial score and the patient's pain goal.   Pain: 0  Pain Goal: 4 [facility] wants you to be able to say your pain was always managed very well.  Stacie Lopez 07/31/2017

## 2017-08-01 ENCOUNTER — Inpatient Hospital Stay (HOSPITAL_COMMUNITY): Payer: BLUE CROSS/BLUE SHIELD

## 2017-08-01 LAB — CBC
HEMATOCRIT: 33.6 % — AB (ref 36.0–46.0)
Hemoglobin: 11.7 g/dL — ABNORMAL LOW (ref 12.0–15.0)
MCH: 31.3 pg (ref 26.0–34.0)
MCHC: 34.8 g/dL (ref 30.0–36.0)
MCV: 89.8 fL (ref 78.0–100.0)
PLATELETS: 139 10*3/uL — AB (ref 150–400)
RBC: 3.74 MIL/uL — ABNORMAL LOW (ref 3.87–5.11)
RDW: 13.6 % (ref 11.5–15.5)
WBC: 9.9 10*3/uL (ref 4.0–10.5)

## 2017-08-01 NOTE — Lactation Note (Signed)
This note was copied from a baby's chart. Lactation Consultation Note Baby 18 hrs old. Mom stated baby has been BF for 2 hrs, crying and feeding. Mom crying, stated she's upset baby crying, she doesn't know what to do, baby must be hungry.  Mom BF on cross cradle position sitting in recliner. Noted baby's head bobbing up and down. Asked mom if felt like baby was biting, mom stated no. Mom has purple red bruising to areolas and positional stripes. Mom stated nipples hurt. Breast starting to fill. Noted baby has good extension of tongue out past gums, tongue curls up in front, lifting tongue, can see frenulum. W/gloved finger assessed suck. Baby chewed, tongue thrusted, w/massageing tongue finally cupped tongue under finger ans suckled some. Has uncoordinated suck. Fitted mom w/#20 NS. Fit comfortable. Mom stated she didn't really feel any difference in breast after baby finished feeding before NS. Encouraged to assess breast before and after BF for transfer.  Baby fought latching, wouldn't suckle on breast either. Removed baby, held and calmed. Baby tired, mom tired, mom cont. To have out burt of crying. FOB encouraging mom not to give up. Mom stating she didn't want to do this anymore. Encouraged mom to talk to baby. It calmed mom and baby. Baby just held nipple in mouth, occasionally would suckle. Baby suckled approx. 3 min. Total. Falling asleep. Then wakes up crying, rooting. Mom asking for a bottle.  Hand expression of a little colostrum. Discussed formula feeding, supply and demand. Similac given. Baby chewed, uncoordinated suck swallow, finally suckled approx. 7ml. Pace feeding demonstrated. Reviewed supplementing amount, encouraging to BF, reasoning for cluster feeding.  Mom exhausted and about to give up on BF unless baby doesn't stop crying and is satisfied w/something. Baby does appear to be hungry, aggressive for a few min. Then stops. LC feels the baby is tired as well as mom.   Patient Name:  Stacie Lopez [name] Reason for consult: Follow-up assessment;Mother's request   Maternal Data Has patient been taught Hand Expression?: Yes Does the patient have breastfeeding experience prior to this delivery?: No  Feeding Feeding Type: Breast Fed Length of feed: 3 min  LATCH Score Latch: Repeated attempts needed to sustain latch, nipple held in mouth throughout feeding, stimulation needed to elicit sucking reflex.  Audible Swallowing: None  Type of Nipple: Everted at rest and after stimulation  Comfort (Breast/Nipple): Filling, red/small blisters or bruises, mild/mod discomfort  Hold (Positioning): Full assist, staff holds infant at breast  LATCH Score: 4  Interventions Interventions: Breast feeding basics reviewed;Position options;Assisted with latch;Reverse pressure;Expressed milk;Skin to skin;Breast compression;Adjust position;Breast massage;Hand express;Support pillows;Comfort gels  Lactation Tools Discussed/Used Tools: Nipple Shields;Comfort gels Nipple shield size: 20   Consult Status Consult Status: Follow-up Date: 08/01/17 Follow-up type: In-patient    Charyl DancerCARVER, Stacie Lopez, 3:48 AM

## 2017-08-01 NOTE — Progress Notes (Signed)
Parent request formula to supplement breast feeding due to difficulty latching and unable to rest Parents have been informed of small tummy size of newborn, taught hand expression and understands the possible consequences of formula to the health of the infant. The possible consequences shared with patent include 1) Loss of confidence in breastfeeding 2) Engorgement 3) Allergic sensitization of baby(asthema/allergies) and 4) decreased milk supply for mother.After discussion of the above the mother decided to give baby formula .The  tool used to give formula supplement will be bottle.

## 2017-08-01 NOTE — Lactation Note (Addendum)
This note was copied from a baby's chart. Lactation Consultation Note  Patient Name: Stacie Lopez Today's Date: 08/01/2017  2nd LC visit for today, last one 11-7 am  Mom desires to go home early.  As  LC entered the room mom feeding the baby a bottle,  Mom explained the baby doesn't like the NS and she is hungry.  Baby took 10 ml and settled down, LC offered to assist with latch and  Mom declined assistance and latching the baby.  LC explored mom options with mom, and per mom she does  Not desire or want to do any pumping, not even a hand pump.  Per mom has mastitis with mom of her baby's and she stopped breast feeding.  LC reassured the LC was here to support her what ever feeding choice she decided on.  Mother informed of post-discharge support and given phone number to the lactation department, including services for phone call assistance; out-patient appointments; and breastfeeding support group. List of other breastfeeding resources in the community given in the handout. Encouraged mother to call for problems or concerns related to breastfeeding.       Maternal Data    Feeding Feeding Type: Breast Fed  LATCH Score                   Interventions    Lactation Tools Discussed/Used Tools: Nipple Shields Nipple shield size: 20   Consult Status      Stacie Lopez 08/01/2017, 10:33 AM

## 2017-08-01 NOTE — Discharge Summary (Signed)
OB Discharge Summary  Patient Name: Stacie Lopez DOB: [birth year] MRN: [id number]  Date of admission: 07/31/2017 Delivering MD: Olivia MackieAAVON, RICHARD   Date of discharge: 08/01/2017  Admitting diagnosis: 39WKS CTX Intrauterine pregnancy: 342w2d     Secondary diagnosis:Principal Problem:   Postpartum care following vaginal delivery (3/6) Active Problems:   Indication for care in labor and delivery, antepartum   SVD (spontaneous vaginal delivery)   Second-degree perineal laceration, with delivery  Additional problems:none    Discharge diagnosis: Term Pregnancy Delivered                                                                     Post partum procedures:none  Augmentation: noen  Complications: None  Hospital course:  Onset of Labor With Vaginal Delivery     37 y.o. yo G3P3003 at 142w2d was admitted in Active Labor on 07/31/2017. Patient had an uncomplicated labor course as follows:  Membrane Rupture Time/Date: 7:52 AM ,07/31/2017   Intrapartum Procedures: Episiotomy: None [1]                                         Lacerations:  2nd degree [3];Perineal [11]  Patient had a delivery of a Viable infant. 07/31/2017  Information for the patient's newborn:  Gabrielle DareKern, Girl Deriona [[id number]][[id number]]  Delivery Method: Vaginal, Spontaneous(Filed from Delivery Summary)    Pateint had an uncomplicated postpartum course.  She is ambulating, tolerating a regular diet, passing flatus, and urinating well. Patient is discharged home in stable condition on 08/01/17.   Physical exam  Vitals:   07/31/17 1149 07/31/17 1643 07/31/17 2330 08/01/17 0857  BP: 110/70 109/70 107/68 108/73  Pulse: 72 61 64 66  Resp: 16 18 16 18   Temp: 98 F (36.7 C) 98.1 F (36.7 C) 97.7 F (36.5 C) 98.2 F (36.8 C)  TempSrc: Oral Oral Oral Oral  SpO2:  100% 96%   Weight:      Height:       General: alert Lochia: appropriate Uterine Fundus: firm Incision: N/A DVT Evaluation: No evidence of DVT seen on physical  exam. Labs: Lab Results  Component Value Date   WBC 9.9 08/01/2017   HGB 11.7 (L) 08/01/2017   HCT 33.6 (L) 08/01/2017   MCV 89.8 08/01/2017   PLT 139 (L) 08/01/2017   CMP Latest Ref Rng & Units 05/23/2011  Glucose 70 - 99 mg/dL 84  BUN 6 - 23 mg/dL 8  Creatinine 7.820.50 - 9.561.10 mg/dL 2.130.51  Sodium 086135 - 578145 mEq/L 136  Potassium 3.5 - 5.1 mEq/L 4.1  Chloride 96 - 112 mEq/L 103  CO2 19 - 32 mEq/L 24  Calcium 8.4 - 10.5 mg/dL 8.5  Total Protein 6.0 - 8.3 g/dL 6.7  Total Bilirubin 0.3 - 1.2 mg/dL 0.5  Alkaline Phos 39 - 117 U/L 85  AST 0 - 37 U/L 17  ALT 0 - 35 U/L 13    Discharge instruction: per After Visit Summary and "Baby and Me Booklet".  After Visit Meds:  Allergies as of 08/01/2017   No Known Allergies     Medication List    TAKE these medications  fluticasone 50 MCG/ACT nasal spray Commonly known as:  FLONASE Place 2 sprays into both nostrils daily.   prenatal multivitamin Tabs tablet Take 1 tablet by mouth daily at 12 noon.       Diet: routine diet  Activity: Advance as tolerated. Pelvic rest for 6 weeks.   Outpatient follow up:6 weeks Follow up Appt:No future appointments. Follow up visit: No Follow-up on file.  Postpartum contraception: Undecided  Newborn Data: Live born female  Birth Weight: 8 lb 4 oz (3742 g) APGAR: 9, 9  Newborn Delivery   Birth date/time:  07/31/2017 08:06:00 Delivery type:  Vaginal, Spontaneous     Baby Feeding: Breast Disposition:home with mother   08/01/2017 Lendon Colonel, MD

## 2018-06-24 ENCOUNTER — Other Ambulatory Visit: Payer: Self-pay | Admitting: Otolaryngology

## 2018-06-24 DIAGNOSIS — J329 Chronic sinusitis, unspecified: Secondary | ICD-10-CM

## 2020-02-02 ENCOUNTER — Other Ambulatory Visit: Payer: Self-pay

## 2020-02-02 ENCOUNTER — Other Ambulatory Visit: Payer: 59

## 2020-02-02 DIAGNOSIS — Z20822 Contact with and (suspected) exposure to covid-19: Secondary | ICD-10-CM

## 2020-02-03 LAB — NOVEL CORONAVIRUS, NAA: SARS-CoV-2, NAA: NOT DETECTED
